# Patient Record
Sex: Male | Born: 1980 | Race: Black or African American | Hispanic: No | Marital: Married | Smoking: Current some day smoker
Health system: Southern US, Community
[De-identification: ages and names within clinical notes are randomized; demographics above are authoritative.]

## PROBLEM LIST (undated history)

## (undated) ENCOUNTER — Emergency Department (HOSPITAL_COMMUNITY): Payer: Self-pay | Source: Home / Self Care

## (undated) DIAGNOSIS — I10 Essential (primary) hypertension: Secondary | ICD-10-CM

## (undated) HISTORY — PX: OTHER SURGICAL HISTORY: SHX169

---

## 2004-12-25 ENCOUNTER — Emergency Department (HOSPITAL_COMMUNITY): Admission: EM | Admit: 2004-12-25 | Discharge: 2004-12-25 | Payer: Self-pay | Admitting: Emergency Medicine

## 2005-08-13 ENCOUNTER — Emergency Department (HOSPITAL_COMMUNITY): Admission: EM | Admit: 2005-08-13 | Discharge: 2005-08-13 | Payer: Self-pay | Admitting: Emergency Medicine

## 2007-06-22 ENCOUNTER — Emergency Department (HOSPITAL_COMMUNITY): Admission: EM | Admit: 2007-06-22 | Discharge: 2007-06-22 | Payer: Self-pay | Admitting: Emergency Medicine

## 2007-09-10 ENCOUNTER — Emergency Department (HOSPITAL_COMMUNITY): Admission: EM | Admit: 2007-09-10 | Discharge: 2007-09-10 | Payer: Self-pay | Admitting: Emergency Medicine

## 2008-02-08 ENCOUNTER — Emergency Department (HOSPITAL_COMMUNITY): Admission: EM | Admit: 2008-02-08 | Discharge: 2008-02-08 | Payer: Self-pay | Admitting: Emergency Medicine

## 2009-04-05 ENCOUNTER — Ambulatory Visit (HOSPITAL_BASED_OUTPATIENT_CLINIC_OR_DEPARTMENT_OTHER): Admission: RE | Admit: 2009-04-05 | Discharge: 2009-04-05 | Payer: Self-pay | Admitting: Urology

## 2010-10-23 ENCOUNTER — Emergency Department (HOSPITAL_COMMUNITY): Payer: 59

## 2010-10-23 ENCOUNTER — Emergency Department (HOSPITAL_COMMUNITY)
Admission: EM | Admit: 2010-10-23 | Discharge: 2010-10-23 | Disposition: A | Discharge status: Home / Self Care | Payer: 59 | Attending: Emergency Medicine | Admitting: Emergency Medicine

## 2010-10-23 ENCOUNTER — Encounter: Payer: Self-pay | Admitting: *Deleted

## 2010-10-23 DIAGNOSIS — M549 Dorsalgia, unspecified: Secondary | ICD-10-CM

## 2010-10-23 DIAGNOSIS — W010XXA Fall on same level from slipping, tripping and stumbling without subsequent striking against object, initial encounter: Secondary | ICD-10-CM

## 2010-10-23 DIAGNOSIS — S2341XA Sprain of ribs, initial encounter: Secondary | ICD-10-CM | POA: Insufficient documentation

## 2010-10-23 DIAGNOSIS — M545 Low back pain, unspecified: Secondary | ICD-10-CM | POA: Insufficient documentation

## 2010-10-23 MED ORDER — CYCLOBENZAPRINE HCL 10 MG PO TABS: 10.0000 mg | ORAL_TABLET | Freq: Three times a day (TID) | ORAL | Status: AC | PRN

## 2010-10-23 MED ORDER — OXYCODONE-ACETAMINOPHEN 5-325 MG PO TABS: ORAL_TABLET | ORAL | Status: DC

## 2010-10-23 MED ORDER — DIAZEPAM 5 MG PO TABS
10.0000 mg | ORAL_TABLET | Freq: Once | ORAL | Status: AC
  Administered 2010-10-23: 10 mg via ORAL
  Filled 2010-10-23: qty 2

## 2010-10-23 MED ORDER — OXYCODONE-ACETAMINOPHEN 5-325 MG PO TABS
1.0000 | ORAL_TABLET | Freq: Once | ORAL | Status: AC
  Administered 2010-10-23: 1 via ORAL
  Filled 2010-10-23: qty 1
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
## 2010-10-23 NOTE — ED Notes (Signed)
EDPA at bedside to examine 

## 2010-10-23 NOTE — ED Notes (Signed)
States he fell this am injuring back , hurts to bend over

## 2010-10-23 NOTE — ED Notes (Signed)
Left in c/o spouse for transport home; in no distress

## 2010-10-30 NOTE — ED Provider Notes (Signed)
History     Chief Complaint  Patient presents with  . Fall    back pain   HPI  History reviewed. No pertinent past medical history.  History reviewed. No pertinent past surgical history.  History reviewed. No pertinent family history.  History  Substance Use Topics  . Smoking status: Not on file  . Smokeless tobacco: Not on file  . Alcohol Use: Yes      Review of Systems  Physical Exam  BP 116/70  Pulse 60  Temp(Src) 98.1 F (36.7 C) (Oral)  Resp 20  Ht 6\' 4"  (1.93 m)  Wt 270 lb (122.471 kg)  BMI 32.87 kg/m2  SpO2 100%  Physical Exam  ED Course  Procedures  MDM    I agree with the history, physical, assessment, and plan of care, with the following exceptions: None    Adeja Sarratt        Sunnie Nielsen, MD 10/30/10 [phone]

## 2011-07-26 ENCOUNTER — Emergency Department (HOSPITAL_COMMUNITY)
Admission: EM | Admit: 2011-07-26 | Discharge: 2011-07-26 | Disposition: A | Discharge status: Home / Self Care | Payer: Self-pay | Attending: Emergency Medicine | Admitting: Emergency Medicine

## 2011-07-26 ENCOUNTER — Encounter (HOSPITAL_COMMUNITY): Payer: Self-pay | Admitting: Emergency Medicine

## 2011-07-26 DIAGNOSIS — T6391XA Toxic effect of contact with unspecified venomous animal, accidental (unintentional), initial encounter: Secondary | ICD-10-CM | POA: Insufficient documentation

## 2011-07-26 DIAGNOSIS — M79642 Pain in left hand: Secondary | ICD-10-CM

## 2011-07-26 DIAGNOSIS — M7989 Other specified soft tissue disorders: Secondary | ICD-10-CM | POA: Insufficient documentation

## 2011-07-26 DIAGNOSIS — T63461A Toxic effect of venom of wasps, accidental (unintentional), initial encounter: Secondary | ICD-10-CM | POA: Insufficient documentation

## 2011-07-26 DIAGNOSIS — Y92009 Unspecified place in unspecified non-institutional (private) residence as the place of occurrence of the external cause: Secondary | ICD-10-CM | POA: Insufficient documentation

## 2011-07-26 DIAGNOSIS — M79609 Pain in unspecified limb: Secondary | ICD-10-CM | POA: Insufficient documentation

## 2011-07-26 MED ORDER — IBUPROFEN 800 MG PO TABS
ORAL_TABLET | ORAL | Status: AC
  Filled 2011-07-26: qty 1

## 2011-07-26 MED ORDER — IBUPROFEN 800 MG PO TABS
800.0000 mg | ORAL_TABLET | Freq: Once | ORAL | Status: AC
  Administered 2011-07-26: 800 mg via ORAL

## 2011-07-26 MED ORDER — DIPHENHYDRAMINE HCL 25 MG PO CAPS
50.0000 mg | ORAL_CAPSULE | Freq: Once | ORAL | Status: AC
  Administered 2011-07-26: 50 mg via ORAL
  Filled 2011-07-26: qty 2

## 2011-07-26 NOTE — ED Notes (Signed)
MD at bedside. 

## 2011-07-26 NOTE — ED Provider Notes (Signed)
History     CSN: [id number]  Arrival date & time 07/26/11  2023   First MD Initiated Contact with Patient 07/26/11 2107      Chief Complaint  Patient presents with  . Insect Bite    Location-L hand/radiation-none/quality-dull/duration-12 hours/timing-constant/severity-mild to moderate/No associated sxs/No prior treatment) Patient is a 31 y.o. male presenting with animal bite. The history is provided by the patient. No language interpreter was used.  Animal Bite  The incident occurred today. The incident occurred at home. He came to the ER via personal transport. There is an injury to the left hand (Wasp sting). The pain is moderate. It is unlikely that a foreign body is present. Pertinent negatives include no chest pain, no fussiness, no numbness, no visual disturbance, no abdominal pain, no bowel incontinence, no nausea, no vomiting, no bladder incontinence, no headaches, no hearing loss, no neck pain, no focal weakness, no decreased responsiveness, no light-headedness, no loss of consciousness, no seizures, no tingling, no weakness, no cough, no difficulty breathing and no memory loss. There have been no prior injuries to these areas. He has been behaving normally. There were no sick contacts. He has received no recent medical care.    History reviewed. No pertinent past medical history.  Past Surgical History  Procedure Date  . Tubes in ears     childhood    History reviewed. No pertinent family history.  History  Substance Use Topics  . Smoking status: Current Everyday Smoker  . Smokeless tobacco: Not on file  . Alcohol Use: Yes      Review of Systems  Constitutional: Negative for fever, chills and decreased responsiveness.  HENT: Negative for hearing loss, neck pain and neck stiffness.   Eyes: Negative for visual disturbance.  Respiratory: Negative for cough, chest tightness and shortness of breath.   Cardiovascular: Negative for chest pain, palpitations and leg  swelling.  Gastrointestinal: Negative for nausea, vomiting, abdominal pain, diarrhea, constipation, blood in stool, abdominal distention and bowel incontinence.  Genitourinary: Negative for bladder incontinence, dysuria, urgency, hematuria and difficulty urinating.  Musculoskeletal: Negative for back pain and gait problem.       L hand swelling after wasp sting.   Skin: Negative for rash.  Neurological: Negative for dizziness, tingling, tremors, focal weakness, seizures, loss of consciousness, syncope, facial asymmetry, speech difficulty, weakness, light-headedness, numbness and headaches.  Hematological: Negative for adenopathy. Does not bruise/bleed easily.  Psychiatric/Behavioral: Negative for memory loss and confusion.    Allergies  Review of patient's allergies indicates no known allergies.  Home Medications  No current outpatient prescriptions on file.  BP 122/76  Pulse 73  Temp(Src) 98.2 F (36.8 C) (Oral)  Resp 20  SpO2 96%  Physical Exam  Constitutional: He is oriented to person, place, and time. He appears well-developed and well-nourished. No distress.  HENT:  Head: Normocephalic.  Eyes: Conjunctivae are normal.  Neck: Normal range of motion. Neck supple.  Cardiovascular: Normal rate, regular rhythm, normal heart sounds and intact distal pulses.   No murmur heard. Pulmonary/Chest: Effort normal and breath sounds normal. No respiratory distress. He has no wheezes. He has no rales. He exhibits no tenderness.  Abdominal: Soft. Bowel sounds are normal. He exhibits no distension. There is no tenderness.  Musculoskeletal: Normal range of motion. He exhibits no edema and no tenderness.       Left wrist: Normal.       Left forearm: Normal.       Left hand: He exhibits tenderness and swelling.  He exhibits normal range of motion, no bony tenderness, normal capillary refill, no deformity and no laceration. normal sensation noted. Normal strength noted.        Hands: Neurological: He is alert and oriented to person, place, and time.  Skin: Skin is warm and dry. He is not diaphoretic.  Psychiatric: He has a normal mood and affect.    ED Course  Procedures (including critical care time)  Labs Reviewed - No data to display No results found.   No diagnosis found.    MDM  Pt is a well appearing 31yo AAM stung by a wasp on L hand 13 hours ago who presents with continued pain and swelling at site. Pt denies hx of airway swelling with past insect stings. No s/s of anaphylaxis. Pt has not tried any meds or tx at home for unknown reasons. Benadryl, motrin and ice given in ED. Tetanus UTD. D/C home with strict return precautions.         Consuello Masse, MD 07/27/11 [phone]

## 2011-07-26 NOTE — ED Notes (Signed)
Patient was stung by a bee at 0800 this morning.  Pt has had increase in swelling to his left hand.  Pt denies any SOB.

## 2011-07-26 NOTE — Discharge Instructions (Signed)
Bee, Wasp, or Hornet Sting Your caregiver has diagnosed you as having an insect sting. An insect sting appears as a red lump in the skin that sometimes has a tiny hole in the center, or it may have a stinger in the center of the wound. The most common stings are from wasps, hornets and bees. Individuals have different reactions to insect stings.  A normal reaction may cause pain, swelling, and redness around the sting site.   A localized allergic reaction may cause swelling and redness that extends beyond the sting site.   A large local reaction may continue to develop over the next 12 to 36 hours.   On occasion, the reactions can be severe (anaphylactic reaction). An anaphylactic reaction may cause wheezing; difficulty breathing; chest pain; fainting; raised, itchy, red patches on the skin; a sick feeling to your stomach (nausea); vomiting; cramping; or diarrhea. If you have had an anaphylactic reaction to an insect sting in the past, you are more likely to have one again.  HOME CARE INSTRUCTIONS   With bee stings, a small sac of poison is left in the wound. Brushing across this with something such as a credit card, or anything similar, will help remove this and decrease the amount of the reaction. This same procedure will not help a wasp sting as they do not leave behind a stinger and poison sac.   Apply a cold compress for 10 to 20 minutes every hour for 1 to 2 days, depending on severity, to reduce swelling and itching.   To lessen pain, a paste made of water and baking soda may be rubbed on the bite or sting and left on for 5 minutes.   To relieve itching and swelling, you may use take medication or apply medicated creams or lotions as directed.   Only take over-the-counter or prescription medicines for pain, discomfort, or fever as directed by your caregiver.   Wash the sting site daily with soap and water. Apply antibiotic ointment on the sting site as directed.   If you suffered a  severe reaction:   If you did not require hospitalization, an adult will need to stay with you for 24 hours in case the symptoms return.   You may need to wear a medical bracelet or necklace stating the allergy.   You and your family need to learn when and how to use an anaphylaxis kit or epinephrine injection.   If you have had a severe reaction before, always carry your anaphylaxis kit with you.  SEEK MEDICAL CARE IF:   None of the above helps within 2 to 3 days.   The area becomes red, warm, tender, and swollen beyond the area of the bite or sting.   You have an oral temperature above 102 F (38.9 C).  SEEK IMMEDIATE MEDICAL CARE IF:  You have symptoms of an allergic reaction which are:  Wheezing.   Difficulty breathing.   Chest pain.   Lightheadedness or fainting.   Itchy, raised, red patches on the skin.   Nausea, vomiting, cramping or diarrhea.  ANY OF THESE SYMPTOMS MAY REPRESENT A SERIOUS PROBLEM THAT IS AN EMERGENCY. Do not wait to see if the symptoms will go away. Get medical help right away. Call your local emergency services (911 in [place] DO NOT drive yourself to the hospital. MAKE SURE YOU:   Understand these instructions.   Will watch your condition.   Will get help right away if you are not doing well or  get worse.  Document Released: 04/02/2005 Document Revised: 03/22/2011 Document Reviewed: 09/17/2009 [facility] Patient Information 2012 [place], [place].Allergic Reaction, Mild to Moderate Allergies may happen from anything your body is sensitive to. This may be food, medications, pollens, chemicals, and nearly anything around you in everyday life that produces allergens. An allergen is anything that causes an allergy producing substance. Allergens cause your body to release allergic antibodies. Through a chain of events, they cause a release of histamine into the blood stream. Histamines are meant to protect you, but they also cause your discomfort. This is why  antihistamines are often used for allergies. Heredity is often a factor in causing allergic reactions. This means you may have some of the same allergies as your parents. Allergies happen in all age groups. You may have some idea of what caused your reaction. There are many allergens around [place]. It may be difficult to know what caused your reaction. If this is a first time event, it may never happen again. Allergies cannot be cured but can be controlled with medications. SYMPTOMS  You may get some or all of the following problems from allergies.  Swelling and itching in and around the mouth.   Tearing, itchy eyes.   Nasal congestion and runny nose.   Sneezing and coughing.   An itchy red rash or hives.   Vomiting or diarrhea.   Difficulty breathing.  Seasonal allergies occur in all age groups. They are seasonal because they usually occur during the same season every year. They may be a reaction to molds, grass pollens, or tree pollens. Other causes of allergies are house dust mite allergens, pet dander and mold spores. These are just a common few of the thousands of allergens around [place]. All of the symptoms listed above happen when you come in contact with pollens and other allergens. Seasonal allergies are usually not life threatening. They are generally more of a nuisance that can often be handled using medications. Hay fever is a combination of all or some of the above listed allergy problems. It may often be treated with simple over-the-counter medications such as diphenhydramine. Take medication as directed. Check with your caregiver or package insert for child dosages. TREATMENT AND HOME CARE INSTRUCTIONS If hives or rash are present:  Take medications as directed.   You may use an over-the-counter antihistamine (diphenhydramine) for hives and itching as needed. Do not drive or drink alcohol until medications used to treat the reaction have worn off. Antihistamines tend to make people  sleepy.   Apply cold cloths (compresses) to the skin or take baths in cool water. This will help itching. Avoid hot baths or showers. Heat will make a rash and itching worse.   If your allergies persist and become more severe, and over the counter medications are not effective, there are many new medications your caretaker can prescribe. Immunotherapy or desensitizing injections can be used if all else fails. Follow up with your caregiver if problems continue.  SEEK MEDICAL CARE IF:   Your allergies are becoming progressively more troublesome.   You suspect a food allergy. Symptoms generally happen within 30 minutes of eating a food.   Your symptoms have not gone away within 2 days or are getting worse.   You develop new symptoms.   You want to retest yourself or your child with a food or drink you think causes an allergic reaction. Never test yourself or your child of a suspected allergy without being under the watchful eye of your  caregivers. A second exposure to an allergen may be life-threatening.  SEEK IMMEDIATE MEDICAL CARE IF:  You develop difficulty breathing or wheezing, or have a tight feeling in your chest or throat.   You develop a swollen mouth, hives, swelling, or itching all over your body.  A severe reaction with any of the above problems should be considered life-threatening. If you suddenly develop difficulty breathing call for local emergency medical help. THIS IS AN EMERGENCY. MAKE SURE YOU:   Understand these instructions.   Will watch your condition.   Will get help right away if you are not doing well or get worse.  Document Released: 01/28/2007 Document Revised: 03/22/2011 Document Reviewed: 01/28/2007 [employer] Patient Information 2012 [employer]  TAKE OVER THE COUNTER MOTRIN AND BENADRYL AS INSTRUCTED ON BOTTLE FOR PAIN AND SWELLING. PLACE ICE TO LEFT HAND AND ELEVATE UNTIL SYMPTOMS IMPROVING.

## 2011-07-27 NOTE — ED Provider Notes (Signed)
I have personally seen and examined the patient.  I have discussed the plan of care with the resident.  I have reviewed the documentation on PMH/FH/Soc. History.  I have reviewed the documentation of the resident and agree.  Pt well appearing no signs of angioedema Safe for d/c  Joya Gaskins, MD 07/27/11 1551

## 2013-04-15 ENCOUNTER — Ambulatory Visit (HOSPITAL_COMMUNITY): Admission: RE | Admit: 2013-04-15 | Payer: No Typology Code available for payment source | Source: Ambulatory Visit

## 2013-04-15 ENCOUNTER — Encounter (HOSPITAL_COMMUNITY): Payer: Self-pay | Admitting: Emergency Medicine

## 2013-04-15 ENCOUNTER — Emergency Department (HOSPITAL_COMMUNITY): Payer: Self-pay

## 2013-04-15 ENCOUNTER — Emergency Department (HOSPITAL_COMMUNITY)
Admission: EM | Admit: 2013-04-15 | Discharge: 2013-04-15 | Discharge status: Against Medical Advice | Payer: Self-pay | Attending: Emergency Medicine | Admitting: Emergency Medicine

## 2013-04-15 DIAGNOSIS — M549 Dorsalgia, unspecified: Secondary | ICD-10-CM

## 2013-04-15 DIAGNOSIS — Y92410 Unspecified street and highway as the place of occurrence of the external cause: Secondary | ICD-10-CM | POA: Insufficient documentation

## 2013-04-15 DIAGNOSIS — F17200 Nicotine dependence, unspecified, uncomplicated: Secondary | ICD-10-CM | POA: Insufficient documentation

## 2013-04-15 DIAGNOSIS — Y9389 Activity, other specified: Secondary | ICD-10-CM | POA: Insufficient documentation

## 2013-04-15 DIAGNOSIS — M542 Cervicalgia: Secondary | ICD-10-CM

## 2013-04-15 DIAGNOSIS — S0993XA Unspecified injury of face, initial encounter: Secondary | ICD-10-CM | POA: Insufficient documentation

## 2013-04-15 DIAGNOSIS — IMO0002 Reserved for concepts with insufficient information to code with codable children: Secondary | ICD-10-CM | POA: Insufficient documentation

## 2013-04-15 NOTE — ED Provider Notes (Signed)
Medical screening examination/treatment/procedure(s) were performed by non-physician practitioner and as supervising physician I was immediately available for consultation/collaboration.  EKG Interpretation   None         Junius Argyle, MD 04/15/13 [phone]
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
## 2013-04-15 NOTE — ED Notes (Signed)
Mother of patient and patient states that they can no longer wait for treatment. Informed patient of risks of leaving and that he would have to leave AMA. States that he understood and informed Trixie Dredge [facility] of patients requests.
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
## 2013-05-27 ENCOUNTER — Emergency Department (HOSPITAL_COMMUNITY)
Admission: EM | Admit: 2013-05-27 | Discharge: 2013-05-27 | Disposition: A | Discharge status: Home / Self Care | Payer: Self-pay | Attending: Emergency Medicine | Admitting: Emergency Medicine

## 2013-05-27 ENCOUNTER — Encounter (HOSPITAL_COMMUNITY): Payer: Self-pay | Admitting: Emergency Medicine

## 2013-05-27 DIAGNOSIS — Z79899 Other long term (current) drug therapy: Secondary | ICD-10-CM | POA: Insufficient documentation

## 2013-05-27 DIAGNOSIS — R21 Rash and other nonspecific skin eruption: Secondary | ICD-10-CM | POA: Insufficient documentation

## 2013-05-27 DIAGNOSIS — K0889 Other specified disorders of teeth and supporting structures: Secondary | ICD-10-CM

## 2013-05-27 DIAGNOSIS — K089 Disorder of teeth and supporting structures, unspecified: Secondary | ICD-10-CM | POA: Insufficient documentation

## 2013-05-27 DIAGNOSIS — M542 Cervicalgia: Secondary | ICD-10-CM | POA: Insufficient documentation

## 2013-05-27 DIAGNOSIS — F17200 Nicotine dependence, unspecified, uncomplicated: Secondary | ICD-10-CM | POA: Insufficient documentation

## 2013-05-27 MED ORDER — SULFAMETHOXAZOLE-TRIMETHOPRIM 800-160 MG PO TABS: 1.0000 | ORAL_TABLET | Freq: Two times a day (BID) | ORAL | Status: DC

## 2013-05-27 MED ORDER — CIPROFLOXACIN HCL 250 MG PO TABS
500.0000 mg | ORAL_TABLET | Freq: Once | ORAL | Status: AC
  Administered 2013-05-27: 500 mg via ORAL
  Filled 2013-05-27: qty 2

## 2013-05-27 MED ORDER — CIPROFLOXACIN HCL 500 MG PO TABS: 500.0000 mg | ORAL_TABLET | Freq: Two times a day (BID) | ORAL | Status: DC

## 2013-05-27 MED ORDER — DICLOFENAC SODIUM 75 MG PO TBEC: 75.0000 mg | DELAYED_RELEASE_TABLET | Freq: Two times a day (BID) | ORAL | Status: DC

## 2013-05-27 MED ORDER — PREDNISONE 50 MG PO TABS
60.0000 mg | ORAL_TABLET | Freq: Once | ORAL | Status: AC
  Administered 2013-05-27: 60 mg via ORAL
  Filled 2013-05-27 (×2): qty 1

## 2013-05-27 MED ORDER — SULFAMETHOXAZOLE-TMP DS 800-160 MG PO TABS
1.0000 | ORAL_TABLET | Freq: Once | ORAL | Status: AC
  Administered 2013-05-27: 1 via ORAL
  Filled 2013-05-27: qty 1

## 2013-05-27 MED ORDER — KETOROLAC TROMETHAMINE 10 MG PO TABS
10.0000 mg | ORAL_TABLET | Freq: Once | ORAL | Status: AC
  Administered 2013-05-27: 10 mg via ORAL
  Filled 2013-05-27: qty 1

## 2013-05-27 NOTE — ED Notes (Signed)
Pt c/o redness and itching to bilateral arms and neck.

## 2013-05-27 NOTE — Discharge Instructions (Signed)
Please use Cipro and Septra for the oral infection, as well as the possible infection going on in the right forearm. Please use diclofenac for inflammation and swelling. Please see a dentist as sone as possible. Please apply warm compresses to your arm daily. Please see your primary physician, or return to the emergency department if the red raised area of the arm is not improving.

## 2013-05-27 NOTE — ED Provider Notes (Signed)
Medical screening examination/treatment/procedure(s) were performed by non-physician practitioner and as supervising physician I was immediately available for consultation/collaboration.  Raheen Capili L [place] Gamero, MD 05/27/13 1513 

## 2013-05-27 NOTE — ED Provider Notes (Signed)
CSN: [id number]     Arrival date & time 05/27/13  1100 History   First MD Initiated Contact with Patient 05/27/13 1125     Chief Complaint  Patient presents with  . Rash     (Consider location/radiation/quality/duration/timing/severity/associated sxs/prior Treatment) HPI Comments: Patient presents to the emergency department with a complaint of rash to the forearm. The patient states that he was in his usual state of health until this morning when he noticed some itching around the area of his mid forearm. He then noted redness and swelling of the area. The itching and discomfort seemed to get progressively worse. Patient states he has also noticed some pain of the right side of his jaw and under his chin. And felt that he also had some swelling on the outside of his left neck. He has not had any injury. No scratch, or no broken skin areas of any of the sites. It is of note that he had an appointment with the dentists on yesterday, he was unable to make it because of a flat tire.  The history is provided by the patient.    History reviewed. No pertinent past medical history. History reviewed. No pertinent past surgical history. No family history on file. History  Substance Use Topics  . Smoking status: Current Some Day Smoker    Types: Cigarettes  . Smokeless tobacco: Never Used  . Alcohol Use: Yes    Review of Systems  Constitutional: Negative for activity change.       All ROS Neg except as noted in HPI  HENT: Positive for dental problem. Negative for trouble swallowing.   Eyes: Negative for photophobia and discharge.  Respiratory: Negative for cough, shortness of breath, wheezing and stridor.   Cardiovascular: Negative for chest pain and palpitations.  Gastrointestinal: Negative for abdominal pain and blood in stool.  Genitourinary: Negative for dysuria, frequency and hematuria.  Musculoskeletal: Positive for neck pain. Negative for arthralgias and back pain.  Skin: Positive  for rash.  Neurological: Negative for dizziness, seizures and speech difficulty.  Psychiatric/Behavioral: Negative for hallucinations and confusion.      Allergies  Review of patient's allergies indicates no known allergies.  Home Medications   Current Outpatient Rx  Name  Route  Sig  Dispense  Refill  . phentermine 37.5 MG capsule   Oral   Take 37.5 mg by mouth daily.          BP 137/71  Pulse 58  Temp(Src) 98.1 F (36.7 C)  Resp 18  Ht 6\' 3"  (1.905 m)  Wt 275 lb (124.739 kg)  BMI 34.37 kg/m2  SpO2 97% Physical Exam  Nursing note and vitals reviewed. Constitutional: He is oriented to person, place, and time. He appears well-developed and well-nourished.  Non-toxic appearance.  HENT:  Head: Normocephalic.  Right Ear: Tympanic membrane and external ear normal.  Left Ear: Tympanic membrane and external ear normal.  The right lower second molar shows a cavity at the base. The third molar is attempting to Molly Maduro that seems to already have some decay as well. There is some swelling of the gum, but no visible abscess in this area. There is no swelling under the tongue. The sublingual area is soft to palpation. The airway is patent. The speech is clear and understandable.  Eyes: EOM and lids are normal. Pupils are equal, round, and reactive to light.  Neck: Normal range of motion. Neck supple. Carotid bruit is not present.  Cardiovascular: Normal rate, regular rhythm, normal heart  sounds, intact distal pulses and normal pulses.   Pulmonary/Chest: Breath sounds normal. No respiratory distress.  Abdominal: Soft. Bowel sounds are normal. There is no tenderness. There is no guarding.  Musculoskeletal: Normal range of motion.  Lymphadenopathy:       Head (right side): No submandibular adenopathy present.       Head (left side): No submandibular adenopathy present.    He has no cervical adenopathy.  Neurological: He is alert and oriented to person, place, and time. He has normal  strength. No cranial nerve deficit or sensory deficit.  Skin: Skin is warm and dry.  There is a red raised warm area of the mid forearm at the palmar surface. There is no red streaking appreciated. There's no evidence of hives. There no other lesions or abnormalities noted of the skin.  Psychiatric: He has a normal mood and affect. His speech is normal.    ED Course  Procedures (including critical care time) Labs Review Labs Reviewed - No data to display Imaging Review No results found.  EKG Interpretation   None       MDM   Final diagnoses:  None    **I have reviewed nursing notes, vital signs, and all appropriate lab and imaging results for this patient.*  Patient developed redness and itching of the right forearm accompanied by soreness of the left neck, and also complains of a toothache of the right lower jaw area.  vital signs are well within normal limits. The pulse oximetry is 97% on room air, within normal limits by my interpretation.  The plan at this time is for the patient to be treated with Cipro and Septra to cover for possible infection as well as the infected tooth. Patient will also be placed on diclofenac for inflammation of the site of the forearm. The patient is given careful structures to return if any changes, red streaking, high fevers, changes in breathing, or difficulty with swallowing.  Kathie DikeHobson M Danelle Curiale, PA-C 05/27/13 1219

## 2013-05-28 ENCOUNTER — Encounter (HOSPITAL_COMMUNITY): Payer: Self-pay | Admitting: Emergency Medicine

## 2014-05-11 ENCOUNTER — Emergency Department: Payer: Self-pay | Admitting: Internal Medicine

## 2015-08-30 ENCOUNTER — Encounter: Payer: Self-pay | Admitting: Emergency Medicine

## 2015-08-30 ENCOUNTER — Emergency Department
Admission: EM | Admit: 2015-08-30 | Discharge: 2015-08-30 | Disposition: A | Discharge status: Home / Self Care | Payer: Self-pay | Attending: Emergency Medicine | Admitting: Emergency Medicine

## 2015-08-30 ENCOUNTER — Emergency Department: Payer: Self-pay

## 2015-08-30 DIAGNOSIS — G44019 Episodic cluster headache, not intractable: Secondary | ICD-10-CM | POA: Insufficient documentation

## 2015-08-30 DIAGNOSIS — F17210 Nicotine dependence, cigarettes, uncomplicated: Secondary | ICD-10-CM | POA: Insufficient documentation

## 2015-08-30 DIAGNOSIS — Z792 Long term (current) use of antibiotics: Secondary | ICD-10-CM | POA: Insufficient documentation

## 2015-08-30 DIAGNOSIS — Z79899 Other long term (current) drug therapy: Secondary | ICD-10-CM | POA: Insufficient documentation

## 2015-08-30 MED ORDER — SUMATRIPTAN SUCCINATE 6 MG/0.5ML ~~LOC~~ SOLN
SUBCUTANEOUS | Status: AC
  Administered 2015-08-30: 6 mg via SUBCUTANEOUS
  Filled 2015-08-30: qty 0.5

## 2015-08-30 MED ORDER — SUMATRIPTAN SUCCINATE 50 MG PO TABS: 50.0000 mg | ORAL_TABLET | Freq: Once | ORAL | Status: DC | PRN

## 2015-08-30 MED ORDER — SUMATRIPTAN SUCCINATE 6 MG/0.5ML ~~LOC~~ SOLN
6.0000 mg | SUBCUTANEOUS | Status: AC
  Administered 2015-08-30: 6 mg via SUBCUTANEOUS

## 2015-08-30 NOTE — ED Notes (Signed)
Patient ambulatory to triage with steady gait, without difficulty or distress noted; pt reports right sided HA this evening unrelieved by goody powder; denies accomp symptoms; denies hx of same

## 2015-08-30 NOTE — ED Provider Notes (Signed)
[facility] Emergency Department Provider Note  ____________________________________________  Time seen: Approximately 9:22 PM  I have reviewed the triage vital signs and the nursing notes.   HISTORY  Chief Complaint Headache    HPI Todd Contreras is a 35 y.o. male this past medical history includes occasional tobacco smoker, obesity, and obstructive sleep apnea diagnosed by a sleep study but who does not use a CPAP.  He presents with acute onset of intermittent sharp and stabbing severe pain to the right side of his face and head.  He reports that it is right behind his eye and that light makes it a little bit worse.  He also notes that the right side of his nose has been running as well and that both his face and the anterior part of the right side of his head hurts.  It will last 15 or more minutes and then will get better but then it recurs again.  He has had similar episodes in the past but the onset about 2 hours ago today has been the most severe.  Nothing in particular makes it better nor worse.  He is concerned because his father reportedly died of a ruptured brain aneurysm about 11 years ago when he was complaining of a headache at the time as well but refused to go to the emergency department.    He denies nausea, vomiting, chest pain, shortness of breath, fever/chills, neck pain, neck stiffness.   History reviewed. No pertinent past medical history.  There are no active problems to display for this patient.   Past Surgical History  Procedure Laterality Date  . Tubes in ears      childhood    Current Outpatient Rx  Name  Route  Sig  Dispense  Refill  . ciprofloxacin (CIPRO) 500 MG tablet   Oral   Take 1 tablet (500 mg total) by mouth 2 (two) times daily.   14 tablet   0   . diclofenac (VOLTAREN) 75 MG EC tablet   Oral   Take 1 tablet (75 mg total) by mouth 2 (two) times daily.   14 tablet   0   . phentermine 37.5 MG  capsule   Oral   Take 37.5 mg by mouth daily.         Marland Kitchen. sulfamethoxazole-trimethoprim (SEPTRA DS) 800-160 MG per tablet   Oral   Take 1 tablet by mouth 2 (two) times daily.   14 tablet   0   . SUMAtriptan (IMITREX) 50 MG tablet   Oral   Take 1 tablet (50 mg total) by mouth once as needed for headache. May repeat in 2 hours if headache persists or recurs.   10 tablet   0     Allergies Review of patient's allergies indicates no known allergies.  No family history on file.  Social History Social History  Substance Use Topics  . Smoking status: Current Some Day Smoker    Types: Cigarettes  . Smokeless tobacco: Never Used  . Alcohol Use: Yes    Review of Systems Constitutional: No fever/chills Eyes: No visual changes. Pain behind his right eye and the right side of his face and head ENT: No sore throat. Clear rhinorrhea from his right nostril while he is having the pain Cardiovascular: Denies chest pain. Respiratory: Denies shortness of breath. Gastrointestinal: No abdominal pain.  No nausea, no vomiting.  No diarrhea.  No constipation. Genitourinary: Negative for dysuria. Musculoskeletal: Negative for back pain. Skin:  Negative for rash. Neurological: Intermittent/episodic severe pain in the right side of his face and head behind his eyes and including running from his right nostril  10-point ROS otherwise negative.  ____________________________________________   PHYSICAL EXAM:  VITAL SIGNS: ED Triage Vitals  Enc Vitals Group     BP 08/30/15 2025 138/81 mmHg     Pulse Rate 08/30/15 2025 71     Resp 08/30/15 2025 20     Temp 08/30/15 2025 98 F (36.7 C)     Temp src --      SpO2 08/30/15 2025 95 %     Weight 08/30/15 2025 320 lb (145.151 kg)     Height 08/30/15 2025  (1.905 m)     Head Cir --      Peak Flow --      Pain Score 08/30/15 2024 9     Pain Loc --      Pain Edu? --      Excl. in GC? --     Constitutional: Alert and oriented. Well  appearing and in no acute distress. Eyes: Conjunctivae are normal. PERRL. EOMI. No nystagmus. Head: Atraumatic.  Tenderness to palpation of the right side of his face and around the right temple. Nose: No congestion/rhinnorhea. Mouth/Throat: Mucous membranes are moist.  Oropharynx non-erythematous. Neck: No stridor.  No meningeal signs.   Cardiovascular: Normal rate, regular rhythm. Good peripheral circulation. Grossly normal heart sounds.   Respiratory: Normal respiratory effort.  No retractions. Lungs CTAB. Gastrointestinal: Soft and nontender. No distention.  Musculoskeletal: No lower extremity tenderness nor edema. No gross deformities of extremities. Neurologic:  Normal speech and language. No gross focal neurologic deficits are appreciated.  Skin:  Skin is warm, dry and intact. No rash noted. Psychiatric: Mood and affect are normal. Speech and behavior are normal.  ____________________________________________   LABS (all labs ordered are listed, but only abnormal results are displayed)  Labs Reviewed - No data to display ____________________________________________  EKG  None ____________________________________________  RADIOLOGY   Ct Head Wo Contrast  08/30/2015  CLINICAL DATA:  Right-sided headache for 2 hours, acute onset. Family history of intracranial aneurysm. EXAM: CT HEAD WITHOUT CONTRAST TECHNIQUE: Contiguous axial images were obtained from the base of the skull through the vertex without intravenous contrast. COMPARISON:  None. FINDINGS: Ventricles and sulci appear symmetrical. No ventricular dilatation. No mass effect or midline shift. No abnormal extra-axial fluid collections. Gray-white matter junctions are distinct. Basal cisterns are not effaced. No evidence of acute intracranial hemorrhage. No depressed skull fractures. Opacification of the right maxillary antrum with mucosal thickening throughout the ethmoid air cells, left maxillary antrum, and sphenoid  sinuses. Mastoid air cells are not opacified. IMPRESSION: No acute intracranial abnormalities. Presumed inflammatory changes in the paranasal sinuses. Electronically Signed   By: Burman Nieves M.D.   On: 08/30/2015 23:12    ____________________________________________   PROCEDURES  Procedure(s) performed: None  Critical Care performed: No ____________________________________________   INITIAL IMPRESSION / ASSESSMENT AND PLAN / ED COURSE  Pertinent labs & imaging results that were available during my care of the patient were reviewed by me and considered in my medical decision making (see chart for details).  The patient's history is consistent with a cluster headache.  I put him on a nonrebreather at 15 L and then going to check on him and 15 minutes.  I anticipate that he will feel much better, but if not, I will also try sumatriptan.  We discussed imaging but at this time  I would like to treat him empirically and he understands and agrees.  ----------------------------------------- 11:11 PM on 08/30/2015 -----------------------------------------  The patient felt considerably better after 15 minutes on the nonrebreather but the pain was not completely gone.  He has wife continued to be somewhat concerned about his headache and the history with his father.  After again discussing the cost/benefit ratio, they prefer to go ahead and get a CT scan of his head.  I ordered that and am awaiting the results although to my eye looks normal.  I also gave him a dose of sumatriptan subcutaneous to see if that would help and he thinks that it then again further help.  He has only a very minor residual amount of pain whereas before it was severe.  He has got a follow-up as an outpatient and I gave my usual and customary return precautions.  I was going to prescribe sumatriptan subcutaneous as an outpatient but I looked up the cost and it cost more than $1200.  Instead as an alternative that may be  helpful, I prescribed sumatriptan 50 mg by mouth with a coupon from [email or web address].  Although this is more of a migraine treatment, if that provides some relief it may be helpful to him.  I explained all this to him as well. ____________________________________________  FINAL CLINICAL IMPRESSION(S) / ED DIAGNOSES  Final diagnoses:  Episodic cluster headache, not intractable     MEDICATIONS GIVEN DURING THIS VISIT:  Medications  SUMAtriptan (IMITREX) injection 6 mg (6 mg Subcutaneous Given 08/30/15 2202)     NEW OUTPATIENT MEDICATIONS STARTED DURING THIS VISIT:  New Prescriptions   SUMATRIPTAN (IMITREX) 50 MG TABLET    Take 1 tablet (50 mg total) by mouth once as needed for headache. May repeat in 2 hours if headache persists or recurs.      Note:  This document was prepared using Dragon voice recognition software and may include unintentional dictation errors.   Loleta Rose, MD 08/30/15 [phone]

## 2015-08-30 NOTE — Discharge Instructions (Signed)
As we discussed, your symptoms are consistent with a cluster headache.  We believe that the prescribed medication may help your symptoms if you take it as soon as you feel symptoms starting, but more likely you will benefit from treating your obstructive sleep apnea and following up with a primary care doctor or neurologist.  Please read through the included information.  Return to the emergency department if you develop new or worsening symptoms that concern you.   Cluster Headache Cluster headaches are recognized by their pattern of deep, intense head pain. They normally occur on one side of your head, but they may "switch sides" in subsequent episodes. Typically, cluster headaches:   Are severe in nature.   Occur repeatedly over weeks to months and are followed by periods of no headaches.   Can last from 15 minutes to 3 hours.   Occur at the same time each day, often at night.   Occur several times a day. CAUSES The exact cause of cluster headaches is not known. Alcohol use may be associated with cluster headaches. SIGNS AND SYMPTOMS   Severe pain that begins in or around your eye or temple.   One-sided head pain.   Feeling sick to your stomach (nauseous).   Sensitivity to light.   Runny nose.   Eye redness, tearing, and nasal stuffiness on the side of your head where you are experiencing pain.   Sweaty, pale skin of the face.   Droopy or swollen eyelid.   Restlessness. DIAGNOSIS  Cluster headaches are diagnosed based on symptoms and a physical exam. Your health care provider may order a CT scan or an MRI of your head or lab tests to see if your headaches are caused by other medical conditions.  TREATMENT   Medicines for pain relief and to prevent recurrent attacks. Some people may need a combination of medicines.  Oxygen for pain relief.   Biofeedback programs to help reduce headache pain.  It may be helpful to keep a headache diary. This may help you  find a trend for what is triggering your headaches. Your health care provider can develop a treatment plan.  HOME CARE INSTRUCTIONS  During cluster periods:   Follow a regular sleep schedule. Do not vary the amount and time that you sleep from day to day. It is important to stay on the same schedule during a cluster period to help prevent headaches.   Avoid alcohol.   Stop smoking if you smoke.  SEEK MEDICAL CARE IF:  You have any changes from your previous cluster headaches either in intensity or frequency.   You are not getting relief from medicines you are taking.  SEEK IMMEDIATE MEDICAL CARE IF:   You faint.   You have weakness or numbness, especially on one side of your body or face.   You have double vision.   You have nausea or vomiting that is not relieved within several hours.   You cannot keep your balance or have difficulty talking or walking.   You have neck pain or stiffness.   You have a fever. MAKE SURE YOU:  Understand these instructions.   Will watch your condition.   Will get help right away if you are not doing well or get worse.   This information is not intended to replace advice given to you by your health care provider. Make sure you discuss any questions you have with your health care provider.   Document Released: 04/02/2005 Document Revised: 01/21/2013 Document Reviewed: 10/23/2012  Elsevier Interactive Patient Education ©2016 [employer] ° °

## 2015-08-30 NOTE — ED Notes (Signed)
Pt reports a headache to the right side, anterior behind eye x 2hours. Pt reports his father died of aneurism with similar symptoms.

## 2017-05-21 ENCOUNTER — Encounter: Payer: Self-pay | Admitting: Emergency Medicine

## 2017-05-21 ENCOUNTER — Emergency Department: Payer: No Typology Code available for payment source

## 2017-05-21 ENCOUNTER — Emergency Department
Admission: EM | Admit: 2017-05-21 | Discharge: 2017-05-21 | Disposition: A | Discharge status: Home / Self Care | Payer: No Typology Code available for payment source | Attending: Student in an Organized Health Care Education/Training Program | Admitting: Student in an Organized Health Care Education/Training Program

## 2017-05-21 ENCOUNTER — Other Ambulatory Visit: Payer: Self-pay

## 2017-05-21 DIAGNOSIS — R69 Illness, unspecified: Secondary | ICD-10-CM

## 2017-05-21 DIAGNOSIS — Z79899 Other long term (current) drug therapy: Secondary | ICD-10-CM | POA: Diagnosis not present

## 2017-05-21 DIAGNOSIS — F17210 Nicotine dependence, cigarettes, uncomplicated: Secondary | ICD-10-CM | POA: Diagnosis not present

## 2017-05-21 DIAGNOSIS — R0602 Shortness of breath: Secondary | ICD-10-CM | POA: Insufficient documentation

## 2017-05-21 DIAGNOSIS — R0789 Other chest pain: Secondary | ICD-10-CM | POA: Diagnosis present

## 2017-05-21 DIAGNOSIS — J40 Bronchitis, not specified as acute or chronic: Secondary | ICD-10-CM | POA: Diagnosis not present

## 2017-05-21 DIAGNOSIS — J111 Influenza due to unidentified influenza virus with other respiratory manifestations: Secondary | ICD-10-CM | POA: Insufficient documentation

## 2017-05-21 LAB — BASIC METABOLIC PANEL
ANION GAP: 10 (ref 5–15)
BUN: 8 mg/dL (ref 6–20)
CHLORIDE: 102 mmol/L (ref 101–111)
CO2: 22 mmol/L (ref 22–32)
Calcium: 8.8 mg/dL — ABNORMAL LOW (ref 8.9–10.3)
Creatinine, Ser: 1.35 mg/dL — ABNORMAL HIGH (ref 0.61–1.24)
GFR calc non Af Amer: >60 mL/min (ref >60)
Glucose, Bld: 105 mg/dL — ABNORMAL HIGH (ref 65–99)
POTASSIUM: 3.7 mmol/L (ref 3.5–5.1)
Sodium: 134 mmol/L — ABNORMAL LOW (ref 135–145)

## 2017-05-21 LAB — TROPONIN I: Troponin I: <0.03 ng/mL (ref <0.03)

## 2017-05-21 LAB — CBC
HEMATOCRIT: 43.5 % (ref 40.0–52.0)
Hemoglobin: 14.8 g/dL (ref 13.0–18.0)
MCH: 29.8 pg (ref 26.0–34.0)
MCHC: 33.9 g/dL (ref 32.0–36.0)
MCV: 88 fL (ref 80.0–100.0)
PLATELETS: 208 10*3/uL (ref 150–440)
RBC: 4.94 MIL/uL (ref 4.40–5.90)
RDW: 13.9 % (ref 11.5–14.5)
WBC: 6.2 10*3/uL (ref 3.8–10.6)

## 2017-05-21 LAB — URINALYSIS, COMPLETE (UACMP) WITH MICROSCOPIC
BACTERIA UA: NONE SEEN
BILIRUBIN URINE: NEGATIVE
GLUCOSE, UA: NEGATIVE mg/dL
Hgb urine dipstick: NEGATIVE
KETONES UR: NEGATIVE mg/dL
Leukocytes, UA: NEGATIVE
NITRITE: NEGATIVE
PH: 5 (ref 5.0–8.0)
Protein, ur: NEGATIVE mg/dL
RBC / HPF: NONE SEEN RBC/hpf (ref 0–5)
Specific Gravity, Urine: 1.014 (ref 1.005–1.030)
Squamous Epithelial / LPF: NONE SEEN

## 2017-05-21 MED ORDER — KETOROLAC TROMETHAMINE 30 MG/ML IJ SOLN
15.0000 mg | Freq: Once | INTRAMUSCULAR | Status: AC
  Administered 2017-05-21: 15 mg via INTRAVENOUS
  Filled 2017-05-21: qty 1

## 2017-05-21 MED ORDER — ALBUTEROL SULFATE HFA 108 (90 BASE) MCG/ACT IN AERS: 2.0000 | INHALATION_SPRAY | Freq: Four times a day (QID) | RESPIRATORY_TRACT | 2 refills | Status: DC | PRN

## 2017-05-21 MED ORDER — IPRATROPIUM-ALBUTEROL 0.5-2.5 (3) MG/3ML IN SOLN
3.0000 mL | Freq: Once | RESPIRATORY_TRACT | Status: AC
  Administered 2017-05-21: 3 mL via RESPIRATORY_TRACT
  Filled 2017-05-21: qty 3

## 2017-05-21 MED ORDER — PREDNISONE 20 MG PO TABS: 40.0000 mg | ORAL_TABLET | Freq: Every day | ORAL | 0 refills | Status: AC

## 2017-05-21 MED ORDER — DEXAMETHASONE 4 MG PO TABS
10.0000 mg | ORAL_TABLET | Freq: Once | ORAL | Status: AC
  Administered 2017-05-21: 10 mg via ORAL
  Filled 2017-05-21: qty 2.5

## 2017-05-21 MED ORDER — GUAIFENESIN-DM 100-10 MG/5ML PO SYRP: 5.0000 mL | ORAL_SOLUTION | ORAL | 0 refills | Status: DC | PRN

## 2017-05-21 NOTE — Discharge Instructions (Signed)
Be sure to drink plenty of fluids.  Return to the ER should he develop any worsening symptoms including worsening shortness of breath worsening chest pain or for any additional questions or concerns.

## 2017-05-21 NOTE — ED Notes (Signed)

## 2017-05-21 NOTE — ED Provider Notes (Signed)
[facility] Emergency Department Provider Note    None    (approximate)  I have reviewed the triage vital signs and the nursing notes.   HISTORY  Chief Complaint Chest Pain and Shortness of Breath    HPI Todd Contreras is a 37 y.o. male chief complaint of 2-3 days of muscle aches headache sore throat nonproductive cough and chills.  Patient states he does have some chest pain with cough.  Does have a history of bronchitis as well as smoking history.  No recent antibiotics.  No vomiting.  Denies any chest pain at rest.  No history of blood clots.  No history of cardiac issues.  Denies any history of diabetes or sickle cell disease.   History reviewed. No pertinent past medical history. No family history on file. Past Surgical History:  Procedure Laterality Date  . tubes in ears     childhood   There are no active problems to display for this patient.     Prior to Admission medications   Medication Sig Start Date End Date Taking? Authorizing Provider  ciprofloxacin (CIPRO) 500 MG tablet Take 1 tablet (500 mg total) by mouth 2 (two) times daily. 05/27/13   Ivery QualeBryant, Hobson, PA-C  diclofenac (VOLTAREN) 75 MG EC tablet Take 1 tablet (75 mg total) by mouth 2 (two) times daily. 05/27/13   Ivery QualeBryant, Hobson, PA-C  phentermine 37.5 MG capsule Take 37.5 mg by mouth daily.    [provider]  sulfamethoxazole-trimethoprim (SEPTRA DS) 800-160 MG per tablet Take 1 tablet by mouth 2 (two) times daily. 05/27/13   Ivery QualeBryant, Hobson, PA-C  SUMAtriptan (IMITREX) 50 MG tablet Take 1 tablet (50 mg total) by mouth once as needed for headache. May repeat in 2 hours if headache persists or recurs. 08/30/15 08/29/16  Loleta RoseForbach, Cory, MD    Allergies Patient has no known allergies.    Social History Social History   Tobacco Use  . Smoking status: Current Some Day Smoker    Types: Cigarettes  . Smokeless tobacco: Never Used  Substance Use Topics  . Alcohol  use: Yes  . Drug use: No    Review of Systems Patient denies headaches, rhinorrhea, blurry vision, numbness, shortness of breath, chest pain, edema, cough, abdominal pain, nausea, vomiting, diarrhea, dysuria, fevers, rashes or hallucinations unless otherwise stated above in HPI. ____________________________________________   PHYSICAL EXAM:  VITAL SIGNS: Vitals:   05/21/17 0919  BP: (!) 146/93  Pulse: 70  Resp: 20  Temp: 98.7 F (37.1 C)  SpO2: 95%    Constitutional: Alert and oriented. Well appearing and in no acute distress. Eyes: Conjunctivae are normal.  Head: Atraumatic. Nose: No congestion/rhinnorhea. Mouth/Throat: Mucous membranes are moist.  Oropharynx clear. Uvula midline Neck: No stridor. Hoarse voice.  No cervical adenopathy Painless ROM.  Cardiovascular: Normal rate, regular rhythm. Grossly normal heart sounds.  Good peripheral circulation. Respiratory: Normal respiratory effort.  No retractions. Lungs with coarse breathsounds throughout Gastrointestinal: Soft and nontender. No distention. No abdominal bruits. No CVA tenderness. Genitourinary:  Musculoskeletal: No lower extremity tenderness nor edema.  No joint effusions. Neurologic:  Normal speech and language. No gross focal neurologic deficits are appreciated. No facial droop Skin:  Skin is warm, dry and intact. No rash noted. Psychiatric: Mood and affect are normal. Speech and behavior are normal.  ____________________________________________   LABS (all labs ordered are listed, but only abnormal results are displayed)  Results for orders placed or performed during the hospital encounter of 05/21/17 (from  the past 24 hour(s))  Basic metabolic panel     Status: Abnormal   Collection Time: 05/21/17  9:17 AM  Result Value Ref Range   Sodium 134 (L) 135 - 145 mmol/L   Potassium 3.7 3.5 - 5.1 mmol/L   Chloride 102 101 - 111 mmol/L   CO2 22 22 - 32 mmol/L   Glucose, Bld 105 (H) 65 - 99 mg/dL   BUN 8 6 - 20  mg/dL   Creatinine, Ser 1.61 (H) 0.61 - 1.24 mg/dL   Calcium 8.8 (L) 8.9 - 10.3 mg/dL   GFR calc non Af Amer >60 >60 mL/min   GFR calc Af Amer >60 >60 mL/min   Anion gap 10 5 - 15  CBC     Status: None   Collection Time: 05/21/17  9:17 AM  Result Value Ref Range   WBC 6.2 3.8 - 10.6 K/uL   RBC 4.94 4.40 - 5.90 MIL/uL   Hemoglobin 14.8 13.0 - 18.0 g/dL   HCT 09.6 04.5 - 40.9 %   MCV 88.0 80.0 - 100.0 fL   MCH 29.8 26.0 - 34.0 pg   MCHC 33.9 32.0 - 36.0 g/dL   RDW 81.1 91.4 - 78.2 %   Platelets 208 150 - 440 K/uL  Troponin I     Status: None   Collection Time: 05/21/17  9:17 AM  Result Value Ref Range   Troponin I <0.03 <0.03 ng/mL  Urinalysis, Complete w Microscopic     Status: Abnormal   Collection Time: 05/21/17 10:07 AM  Result Value Ref Range   Color, Urine YELLOW (A) YELLOW   APPearance CLEAR (A) CLEAR   Specific Gravity, Urine 1.014 1.005 - 1.030   pH 5.0 5.0 - 8.0   Glucose, UA NEGATIVE NEGATIVE mg/dL   Hgb urine dipstick NEGATIVE NEGATIVE   Bilirubin Urine NEGATIVE NEGATIVE   Ketones, ur NEGATIVE NEGATIVE mg/dL   Protein, ur NEGATIVE NEGATIVE mg/dL   Nitrite NEGATIVE NEGATIVE   Leukocytes, UA NEGATIVE NEGATIVE   RBC / HPF NONE SEEN 0 - 5 RBC/hpf   WBC, UA 0-5 0 - 5 WBC/hpf   Bacteria, UA NONE SEEN NONE SEEN   Squamous Epithelial / LPF NONE SEEN NONE SEEN   Mucus PRESENT    ____________________________________________  EKG My review and personal interpretation at Time: 9:14   Indication: chest pain  Rate: 75  Rhythm: sinus Axis: normal Other: nonspecific st abnormality, no stemi, poor r wave progression ____________________________________________  RADIOLOGY  I personally reviewed all radiographic images ordered to evaluate for the above acute complaints and reviewed radiology reports and findings.  These findings were personally discussed with the patient.  Please see medical record for radiology  report.  ____________________________________________   PROCEDURES  Procedure(s) performed:  Procedures    Critical Care performed: no ____________________________________________   INITIAL IMPRESSION / ASSESSMENT AND PLAN / ED COURSE  Pertinent labs & imaging results that were available during my care of the patient were reviewed by me and considered in my medical decision making (see chart for details).  DDX: Asthma, copd, CHF, pna, ptx, malignancy, Pe, anemia   Todd Contreras is a 37 y.o. who presents to the ED with symptoms as described above.  Patient is well-appearing and in no acute distress.  His abdominal exam is soft and benign.  Lung sounds are consistent with bronchitis.  There is no evidence of CHF.  Not clinically consistent with ACS based on duration of symptoms negative troponin and nonischemic  EKG.  Not consistent with pericarditis.  Patient is low risk by Wells criteria and is PERC negative.  Given his myalgias and flulike symptoms this does seem most clinically consistent with flulike illness.  Symptoms have been going on for 2 days now and unlikely to derive any benefit from Tamiflu.  We will treat for bronchitis.  Patient tolerating oral hydration.  Patient stable and appropriate for outpatient follow-up.  Clinical Course as of May 22 1107  Tue May 21, 2017  1109 Anion gap: 10 [PR]    Clinical Course User Index [PR] Willy Eddy, MD     ____________________________________________   FINAL CLINICAL IMPRESSION(S) / ED DIAGNOSES  Final diagnoses:  Influenza-like illness  Bronchitis      NEW MEDICATIONS STARTED DURING THIS VISIT:  New Prescriptions   No medications on file     Note:  This document was prepared using Dragon voice recognition software and may include unintentional dictation errors.    Willy Eddy, MD 05/21/17 [phone]

## 2017-05-21 NOTE — ED Notes (Signed)
Sent from Dr office.

## 2017-05-21 NOTE — ED Triage Notes (Addendum)
Presents with family from PCP with chest pain and SOB   Ext swelling  headache

## 2018-10-15 ENCOUNTER — Emergency Department
Admission: EM | Admit: 2018-10-15 | Discharge: 2018-10-15 | Disposition: A | Discharge status: Home / Self Care | Payer: No Typology Code available for payment source | Attending: Student in an Organized Health Care Education/Training Program | Admitting: Student in an Organized Health Care Education/Training Program

## 2018-10-15 ENCOUNTER — Emergency Department: Payer: No Typology Code available for payment source

## 2018-10-15 ENCOUNTER — Other Ambulatory Visit: Payer: Self-pay

## 2018-10-15 ENCOUNTER — Encounter: Payer: Self-pay | Admitting: *Deleted

## 2018-10-15 DIAGNOSIS — I10 Essential (primary) hypertension: Secondary | ICD-10-CM | POA: Insufficient documentation

## 2018-10-15 DIAGNOSIS — Z79899 Other long term (current) drug therapy: Secondary | ICD-10-CM | POA: Insufficient documentation

## 2018-10-15 DIAGNOSIS — M7989 Other specified soft tissue disorders: Secondary | ICD-10-CM | POA: Insufficient documentation

## 2018-10-15 DIAGNOSIS — F17210 Nicotine dependence, cigarettes, uncomplicated: Secondary | ICD-10-CM | POA: Insufficient documentation

## 2018-10-15 LAB — HEPATIC FUNCTION PANEL
ALT: 18 U/L (ref 0–44)
AST: 15 U/L (ref 15–41)
Albumin: 3.9 g/dL (ref 3.5–5.0)
Alkaline Phosphatase: 79 U/L (ref 38–126)
Bilirubin, Direct: 0.1 mg/dL (ref 0.0–0.2)
Indirect Bilirubin: 0.5 mg/dL (ref 0.3–0.9)
Total Bilirubin: 0.6 mg/dL (ref 0.3–1.2)
Total Protein: 7.4 g/dL (ref 6.5–8.1)

## 2018-10-15 LAB — URINALYSIS, COMPLETE (UACMP) WITH MICROSCOPIC
Bacteria, UA: NONE SEEN
Bilirubin Urine: NEGATIVE
Glucose, UA: NEGATIVE mg/dL
Hgb urine dipstick: NEGATIVE
Ketones, ur: NEGATIVE mg/dL
Leukocytes,Ua: NEGATIVE
Nitrite: NEGATIVE
Protein, ur: NEGATIVE mg/dL
Specific Gravity, Urine: 1.01 (ref 1.005–1.030)
pH: 6 (ref 5.0–8.0)

## 2018-10-15 LAB — BASIC METABOLIC PANEL
Anion gap: 8 (ref 5–15)
BUN: 9 mg/dL (ref 6–20)
CO2: 25 mmol/L (ref 22–32)
Calcium: 9.2 mg/dL (ref 8.9–10.3)
Chloride: 106 mmol/L (ref 98–111)
Creatinine, Ser: 1.24 mg/dL (ref 0.61–1.24)
GFR calc Af Amer: >60 mL/min (ref >60)
GFR calc non Af Amer: >60 mL/min (ref >60)
Glucose, Bld: 120 mg/dL — ABNORMAL HIGH (ref 70–99)
Potassium: 3.9 mmol/L (ref 3.5–5.1)
Sodium: 139 mmol/L (ref 135–145)

## 2018-10-15 LAB — CBC
HCT: 43.7 % (ref 39.0–52.0)
Hemoglobin: 15.3 g/dL (ref 13.0–17.0)
MCH: 29.9 pg (ref 26.0–34.0)
MCHC: 35 g/dL (ref 30.0–36.0)
MCV: 85.4 fL (ref 80.0–100.0)
Platelets: 239 10*3/uL (ref 150–400)
RBC: 5.12 MIL/uL (ref 4.22–5.81)
RDW: 13.1 % (ref 11.5–15.5)
WBC: 5.6 10*3/uL (ref 4.0–10.5)
nRBC: 0 % (ref 0.0–0.2)

## 2018-10-15 MED ORDER — HYDROCHLOROTHIAZIDE 10 MG/ML ORAL SUSPENSION
6.2500 mg | Freq: Every day | ORAL | Status: DC
  Filled 2018-10-15: qty 1.25

## 2018-10-15 MED ORDER — HYDROCHLOROTHIAZIDE 12.5 MG PO CAPS: 12.5000 mg | ORAL_CAPSULE | Freq: Every day | ORAL | 0 refills | Status: AC

## 2018-10-15 MED ORDER — HYDROCHLOROTHIAZIDE 12.5 MG PO CAPS
12.5000 mg | ORAL_CAPSULE | Freq: Once | ORAL | Status: AC
  Administered 2018-10-15: 12.5 mg via ORAL

## 2018-10-15 NOTE — ED Notes (Signed)
Patient states his left leg is more swollen today and has a tight feeling. Per patient the swelling usually goes away when he wears his compression socks and keeps leg elevated but has not worked this time and was worrying him.

## 2018-10-15 NOTE — ED Notes (Signed)
Ultrasound in with patient

## 2018-10-15 NOTE — ED Notes (Signed)
Ultrasound is complete.

## 2018-10-15 NOTE — ED Triage Notes (Signed)
Pt ambulatory to triage.  Pt has swelling to left lower leg for 1 week.  No pain.  No known injury to leg.  No chest pain or sob.  Pt alert  Speech clear.

## 2018-10-15 NOTE — ED Provider Notes (Signed)
[facility] Emergency Department Provider Note    First MD Initiated Contact with Patient 10/15/18 1943     (approximate)  I have reviewed the triage vital signs and the nursing notes.   HISTORY  Chief Complaint Leg Swelling    HPI Todd Contreras is a 38 y.o. male no significant past medical history presents the ER for progressively worsening left lower extremity swelling.  Denies any shortness of breath orthopnea.  Denies any history of DVT or leg swelling.  Denies any trauma.  Does not have any personal history of hypertension or kidney disease.  No history of heart failure.  He denies any chest pain.  No nausea or vomiting.    No past medical history on file. No family history on file. Past Surgical History:  Procedure Laterality Date  . tubes in ears     childhood   There are no active problems to display for this patient.     Prior to Admission medications   Medication Sig Start Date End Date Taking? Authorizing Provider  albuterol (PROVENTIL HFA;VENTOLIN HFA) 108 (90 Base) MCG/ACT inhaler Inhale 2 puffs into the lungs every 6 (six) hours as needed for wheezing or shortness of breath. 05/21/17   Willy Eddyobinson, Malacai Grantz, MD  ciprofloxacin (CIPRO) 500 MG tablet Take 1 tablet (500 mg total) by mouth 2 (two) times daily. 05/27/13   Ivery QualeBryant, Hobson, PA-C  diclofenac (VOLTAREN) 75 MG EC tablet Take 1 tablet (75 mg total) by mouth 2 (two) times daily. 05/27/13   Ivery QualeBryant, Hobson, PA-C  guaiFENesin-dextromethorphan (ROBITUSSIN DM) 100-10 MG/5ML syrup Take 5 mLs by mouth every 4 (four) hours as needed for cough. 05/21/17   Willy Eddyobinson, Lynett Brasil, MD  hydrochlorothiazide (MICROZIDE) 12.5 MG capsule Take 1 capsule (12.5 mg total) by mouth daily. 10/15/18 10/15/19  Willy Eddyobinson, Mariafernanda Hendricksen, MD  phentermine 37.5 MG capsule Take 37.5 mg by mouth daily.    [provider]  sulfamethoxazole-trimethoprim (SEPTRA DS) 800-160 MG per tablet Take 1 tablet by mouth 2 (two)  times daily. 05/27/13   Ivery QualeBryant, Hobson, PA-C  SUMAtriptan (IMITREX) 50 MG tablet Take 1 tablet (50 mg total) by mouth once as needed for headache. May repeat in 2 hours if headache persists or recurs. 08/30/15 08/29/16  Loleta RoseForbach, Cory, MD    Allergies Patient has no known allergies.    Social History Social History   Tobacco Use  . Smoking status: Current Some Day Smoker    Types: Cigarettes  . Smokeless tobacco: Never Used  Substance Use Topics  . Alcohol use: Not Currently  . Drug use: No    Review of Systems Patient denies headaches, rhinorrhea, blurry vision, numbness, shortness of breath, chest pain, edema, cough, abdominal pain, nausea, vomiting, diarrhea, dysuria, fevers, rashes or hallucinations unless otherwise stated above in HPI. ____________________________________________   PHYSICAL EXAM:  VITAL SIGNS: Vitals:   10/15/18 1935 10/15/18 2200  BP: (!) 140/106 (!) 152/116  Pulse: 66   Resp: 18   Temp: 98.2 F (36.8 C)   SpO2: 97%     Constitutional: Alert and oriented.  Eyes: Conjunctivae are normal.  Head: Atraumatic. Nose: No congestion/rhinnorhea. Mouth/Throat: Mucous membranes are moist.   Neck: No stridor. Painless ROM.  Cardiovascular: Normal rate, regular rhythm. Grossly normal heart sounds.  Good peripheral circulation. Respiratory: Normal respiratory effort.  No retractions. Lungs CTAB. Gastrointestinal: Soft and nontender. No distention. No abdominal bruits. No CVA tenderness. Genitourinary: deferred Musculoskeletal: No lower extremity tenderness.  L>R pitting edema.  No joint effusions.  Neurologic:  Normal speech and language. No gross focal neurologic deficits are appreciated. No facial droop Skin:  Skin is warm, dry and intact. No rash noted. Psychiatric: Mood and affect are normal. Speech and behavior are normal.  ____________________________________________   LABS (all labs ordered are listed, but only abnormal results are displayed)   Results for orders placed or performed during the hospital encounter of 10/15/18 (from the past 24 hour(s))  Basic metabolic panel     Status: Abnormal   Collection Time: 10/15/18  4:09 PM  Result Value Ref Range   Sodium 139 135 - 145 mmol/L   Potassium 3.9 3.5 - 5.1 mmol/L   Chloride 106 98 - 111 mmol/L   CO2 25 22 - 32 mmol/L   Glucose, Bld 120 (H) 70 - 99 mg/dL   BUN 9 6 - 20 mg/dL   Creatinine, Ser 1.24 0.61 - 1.24 mg/dL   Calcium 9.2 8.9 - 10.3 mg/dL   GFR calc non Af Amer >60 >60 mL/min   GFR calc Af Amer >60 >60 mL/min   Anion gap 8 5 - 15  CBC     Status: None   Collection Time: 10/15/18  4:09 PM  Result Value Ref Range   WBC 5.6 4.0 - 10.5 K/uL   RBC 5.12 4.22 - 5.81 MIL/uL   Hemoglobin 15.3 13.0 - 17.0 g/dL   HCT 43.7 39.0 - 52.0 %   MCV 85.4 80.0 - 100.0 fL   MCH 29.9 26.0 - 34.0 pg   MCHC 35.0 30.0 - 36.0 g/dL   RDW 13.1 11.5 - 15.5 %   Platelets 239 150 - 400 K/uL   nRBC 0.0 0.0 - 0.2 %  Hepatic function panel     Status: None   Collection Time: 10/15/18  4:09 PM  Result Value Ref Range   Total Protein 7.4 6.5 - 8.1 g/dL   Albumin 3.9 3.5 - 5.0 g/dL   AST 15 15 - 41 U/L   ALT 18 0 - 44 U/L   Alkaline Phosphatase 79 38 - 126 U/L   Total Bilirubin 0.6 0.3 - 1.2 mg/dL   Bilirubin, Direct 0.1 0.0 - 0.2 mg/dL   Indirect Bilirubin 0.5 0.3 - 0.9 mg/dL  Urinalysis, Complete w Microscopic     Status: Abnormal   Collection Time: 10/15/18  8:40 PM  Result Value Ref Range   Color, Urine YELLOW (A) YELLOW   APPearance CLEAR (A) CLEAR   Specific Gravity, Urine 1.010 1.005 - 1.030   pH 6.0 5.0 - 8.0   Glucose, UA NEGATIVE NEGATIVE mg/dL   Hgb urine dipstick NEGATIVE NEGATIVE   Bilirubin Urine NEGATIVE NEGATIVE   Ketones, ur NEGATIVE NEGATIVE mg/dL   Protein, ur NEGATIVE NEGATIVE mg/dL   Nitrite NEGATIVE NEGATIVE   Leukocytes,Ua NEGATIVE NEGATIVE   RBC / HPF 0-5 0 - 5 RBC/hpf   WBC, UA 0-5 0 - 5 WBC/hpf   Bacteria, UA NONE SEEN NONE SEEN   Squamous Epithelial /  LPF 0-5 0 - 5   Mucus PRESENT    ____________________________________________ ____________________________________________  RADIOLOGY  I personally reviewed all radiographic images ordered to evaluate for the above acute complaints and reviewed radiology reports and findings.  These findings were personally discussed with the patient.  Please see medical record for radiology report.  ____________________________________________   PROCEDURES  Procedure(s) performed:  Procedures    Critical Care performed: no ____________________________________________   INITIAL IMPRESSION / ASSESSMENT AND PLAN / ED COURSE  Pertinent labs &  imaging results that were available during my care of the patient were reviewed by me and considered in my medical decision making (see chart for details).   DDX: DVT, lymphedema, nephrotic syndrome, congestive heart failure, hypertension  Todd Contreras is a 38 y.o. who presents to the ED with symptoms as described above.  Blood work and imaging sent on for the by differential.  Clinical Course as of Oct 14 2329  Wed Oct 15, 2018  2129 Blood work reassuring.  No evidence of DVT.  No evidence of nephrotic syndrome.  Will encourage continued compression stockings.  Will give short course of thiazide diuretic based on patient's blood pressure elevations and arrange follow-up with PCP.   [PR]    Clinical Course User Index [PR] Willy Eddyobinson, Kaladin Noseworthy, MD    The patient was evaluated in Emergency Department today for the symptoms described in the history of present illness. He/she was evaluated in the context of the global COVID-19 pandemic, which necessitated consideration that the patient might be at risk for infection with the SARS-CoV-2 virus that causes COVID-19. Institutional protocols and algorithms that pertain to the evaluation of patients at risk for COVID-19 are in a state of rapid change based on information released by regulatory bodies  including the CDC and federal and state organizations. These policies and algorithms were followed during the patient's care in the ED.  As part of my medical decision making, I reviewed the following data within the electronic MEDICAL RECORD NUMBER Nursing notes reviewed and incorporated, Labs reviewed, notes from prior ED visits and [place] Controlled Substance Database   ____________________________________________   FINAL CLINICAL IMPRESSION(S) / ED DIAGNOSES  Final diagnoses:  Leg swelling  Hypertension, unspecified type      NEW MEDICATIONS STARTED DURING THIS VISIT:  Discharge Medication List as of 10/15/2018  9:29 PM    START taking these medications   Details  hydrochlorothiazide (MICROZIDE) 12.5 MG capsule Take 1 capsule (12.5 mg total) by mouth daily., Starting Wed 10/15/2018, Until Thu 10/15/2019, Normal         Note:  This document was prepared using Dragon voice recognition software and may include unintentional dictation errors.    Willy Eddyobinson, Tyrina Hines, MD 10/15/18 [phone]

## 2021-06-08 ENCOUNTER — Emergency Department: Payer: No Typology Code available for payment source

## 2021-06-08 ENCOUNTER — Other Ambulatory Visit: Payer: Self-pay

## 2021-06-08 ENCOUNTER — Emergency Department
Admission: EM | Admit: 2021-06-08 | Discharge: 2021-06-08 | Disposition: A | Discharge status: Home / Self Care | Payer: No Typology Code available for payment source | Attending: Emergency Medicine | Admitting: Emergency Medicine

## 2021-06-08 ENCOUNTER — Encounter: Payer: Self-pay | Admitting: Emergency Medicine

## 2021-06-08 DIAGNOSIS — R519 Headache, unspecified: Secondary | ICD-10-CM | POA: Diagnosis present

## 2021-06-08 DIAGNOSIS — J0100 Acute maxillary sinusitis, unspecified: Secondary | ICD-10-CM | POA: Insufficient documentation

## 2021-06-08 DIAGNOSIS — Z20822 Contact with and (suspected) exposure to covid-19: Secondary | ICD-10-CM | POA: Insufficient documentation

## 2021-06-08 DIAGNOSIS — R1013 Epigastric pain: Secondary | ICD-10-CM | POA: Insufficient documentation

## 2021-06-08 LAB — COMPREHENSIVE METABOLIC PANEL
ALT: 27 U/L (ref 0–44)
AST: 23 U/L (ref 15–41)
Albumin: 4 g/dL (ref 3.5–5.0)
Alkaline Phosphatase: 102 U/L (ref 38–126)
Anion gap: 8 (ref 5–15)
BUN: 12 mg/dL (ref 6–20)
CO2: 22 mmol/L (ref 22–32)
Calcium: 8.7 mg/dL — ABNORMAL LOW (ref 8.9–10.3)
Chloride: 105 mmol/L (ref 98–111)
Creatinine, Ser: 1.09 mg/dL (ref 0.61–1.24)
GFR, Estimated: >60 mL/min (ref >60)
Glucose, Bld: 109 mg/dL — ABNORMAL HIGH (ref 70–99)
Potassium: 3.9 mmol/L (ref 3.5–5.1)
Sodium: 135 mmol/L (ref 135–145)
Total Bilirubin: 0.6 mg/dL (ref 0.3–1.2)
Total Protein: 7.9 g/dL (ref 6.5–8.1)

## 2021-06-08 LAB — URINALYSIS, ROUTINE W REFLEX MICROSCOPIC
Bilirubin Urine: NEGATIVE
Glucose, UA: NEGATIVE mg/dL
Hgb urine dipstick: NEGATIVE
Ketones, ur: NEGATIVE mg/dL
Leukocytes,Ua: NEGATIVE
Nitrite: NEGATIVE
Protein, ur: NEGATIVE mg/dL
Specific Gravity, Urine: 1.01 (ref 1.005–1.030)
pH: 5 (ref 5.0–8.0)

## 2021-06-08 LAB — CBC
HCT: 45.4 % (ref 39.0–52.0)
Hemoglobin: 15.8 g/dL (ref 13.0–17.0)
MCH: 29.8 pg (ref 26.0–34.0)
MCHC: 34.8 g/dL (ref 30.0–36.0)
MCV: 85.7 fL (ref 80.0–100.0)
Platelets: 292 10*3/uL (ref 150–400)
RBC: 5.3 MIL/uL (ref 4.22–5.81)
RDW: 13 % (ref 11.5–15.5)
WBC: 8.8 10*3/uL (ref 4.0–10.5)
nRBC: 0 % (ref 0.0–0.2)

## 2021-06-08 LAB — RESP PANEL BY RT-PCR (FLU A&B, COVID) ARPGX2
Influenza A by PCR: NEGATIVE
Influenza B by PCR: NEGATIVE
SARS Coronavirus 2 by RT PCR: NEGATIVE

## 2021-06-08 LAB — TROPONIN I (HIGH SENSITIVITY)
Troponin I (High Sensitivity): 5 ng/L (ref <18)
Troponin I (High Sensitivity): 5 ng/L (ref <18)

## 2021-06-08 LAB — LIPASE, BLOOD: Lipase: 34 U/L (ref 11–51)

## 2021-06-08 MED ORDER — PREDNISONE 20 MG PO TABS: 40.0000 mg | ORAL_TABLET | Freq: Every day | ORAL | 0 refills | Status: AC

## 2021-06-08 MED ORDER — ACETAMINOPHEN 325 MG PO TABS
650.0000 mg | ORAL_TABLET | Freq: Once | ORAL | Status: AC
  Administered 2021-06-08: 650 mg via ORAL
  Filled 2021-06-08: qty 2

## 2021-06-08 MED ORDER — FAMOTIDINE 20 MG PO TABS: 20.0000 mg | ORAL_TABLET | Freq: Two times a day (BID) | ORAL | 0 refills | Status: AC

## 2021-06-08 MED ORDER — METOCLOPRAMIDE HCL 10 MG PO TABS
10.0000 mg | ORAL_TABLET | Freq: Once | ORAL | Status: AC
  Administered 2021-06-08: 10 mg via ORAL
  Filled 2021-06-08: qty 1

## 2021-06-08 MED ORDER — PREDNISONE 20 MG PO TABS
60.0000 mg | ORAL_TABLET | Freq: Once | ORAL | Status: AC
  Administered 2021-06-08: 60 mg via ORAL
  Filled 2021-06-08: qty 3

## 2021-06-08 MED ORDER — ONDANSETRON 4 MG PO TBDP: 4.0000 mg | ORAL_TABLET | Freq: Three times a day (TID) | ORAL | 0 refills | Status: AC | PRN

## 2021-06-08 NOTE — ED Notes (Signed)
See triage note  presents with frontal headache and congestion for about 2 weeks  was recently treated for sinus infection  also having intermittent epigastric pain afebrile on arrival

## 2021-06-08 NOTE — ED Triage Notes (Signed)
Pt here with a headache and abd pain. Pt states headache started a week and a half ago and has been constant. Pt states that abd pain is upper and centered and has also been constant. Pt denies N/V/D. Pt in NAD in triage.

## 2021-06-08 NOTE — ED Notes (Signed)
EKG performed.

## 2021-06-08 NOTE — ED Provider Notes (Signed)
[facility] Emergency Department Provider Note     Event Date/Time   First MD Initiated Contact with Patient 06/08/21 [phone]     (approximate)   History   Headache and Abdominal Pain   HPI  Todd Contreras is a 41 y.o. male presents himself to the ED for evaluation of persistent headache as well as some upper abdominal pain.  Patient reports a frontal headache has been persistent for the last week and a half.  He is currently on a course of Augmentin for a sinus infection. He has not taken any other meds for symptom relief.  Patient denies any history of persistent headache syndrome.  He describes the headache as frontal pressure.Raechel Chute he also reports some chronic, central abdominal pain has been constant but he denies any associated nausea, vomiting, or diarrhea.  Denies any cough, congestion, chest pain, or shortness of breath.     Physical Exam   Triage Vital Signs: ED Triage Vitals  Enc Vitals Group     BP 06/08/21 0717 130/79     Pulse Rate 06/08/21 0717 (!) 101     Resp 06/08/21 0717 20     Temp 06/08/21 0717 97.9 F (36.6 C)     Temp Source 06/08/21 0717 Oral     SpO2 06/08/21 0717 96 %     Weight 06/08/21 0715 (!) 380 lb (172.4 kg)     Height 06/08/21 0715 6\' 3"  (1.905 m)     Head Circumference --      Peak Flow --      Pain Score 06/08/21 0715 7     Pain Loc --      Pain Edu? --      Excl. in GC? --     Most recent vital signs: Vitals:   06/08/21 0937 06/08/21 1132  BP: (!) 143/87 140/80  Pulse: 79 70  Resp: 18 18  Temp: 97.7 F (36.5 C)   SpO2: 95% 96%    General Awake, no distress.  HEENT NCAT. PERRL. EOMI. No rhinorrhea. Mucous membranes are moist.  CV:  Good peripheral perfusion.  RESP:  Normal effort.  ABD:  No distention.   ED Results / Procedures / Treatments   Labs (all labs ordered are listed, but only abnormal results are displayed) Labs Reviewed  COMPREHENSIVE METABOLIC PANEL - Abnormal;  Notable for the following components:      Result Value   Glucose, Bld 109 (*)    Calcium 8.7 (*)    All other components within normal limits  URINALYSIS, ROUTINE W REFLEX MICROSCOPIC - Abnormal; Notable for the following components:   Color, Urine YELLOW (*)    APPearance CLEAR (*)    All other components within normal limits  RESP PANEL BY RT-PCR (FLU A&B, COVID) ARPGX2  LIPASE, BLOOD  CBC  TROPONIN I (HIGH SENSITIVITY)  TROPONIN I (HIGH SENSITIVITY)     EKG  Vent. rate 81 BPM PR interval 176 ms QRS duration 92 ms QT/QTcB 380/441 ms P-R-T axes 18 0 7 No STEMI Normal axis  RADIOLOGY  I personally viewed and evaluated these images as part of my medical decision making, as well as reviewing the written report by the radiologist.  ED Provider Interpretation: findings consistent with sinusitis, no acute abdominal findings}  CT HEAD WO CONTRAST ( )  Result Date: 06/08/2021 CLINICAL DATA:  Headache, chronic, new features or increased frequency EXAM: CT HEAD WITHOUT CONTRAST TECHNIQUE: Contiguous axial images were obtained from  the base of the skull through the vertex without intravenous contrast. RADIATION DOSE REDUCTION: This exam was performed according to the departmental dose-optimization program which includes automated exposure control, adjustment of the mA and/or kV according to patient size and/or use of iterative reconstruction technique. COMPARISON:  CT head Aug 30, 2015. FINDINGS: Brain: No evidence of acute infarction, hemorrhage, hydrocephalus, extra-axial collection or mass lesion/mass effect. Vascular: No hyperdense vessel identified. Skull: No acute fracture. Sinuses/Orbits: Moderate mucosal thickening the left maxillary sinus with frothy secretions and air-fluid level. Moderate mucosal thickening of scattered ethmoid air cells. Other: No mastoid effusions. IMPRESSION: 1. No evidence of acute intracranial abnormality. 2. Predominately ethmoid air cell and left  maxillary sinus mucosal thickening with frothy secretions and air-fluid level. Correlate with signs/symptoms of sinusitis. Electronically Signed   By: Margaretha Sheffield M.D.   On: 06/08/2021 09:44   US Abdomen Limited RUQ (LIVER/GB)  Result Date: 06/08/2021 CLINICAL DATA:  Epigastric pain abdominal pain EXAM: ULTRASOUND ABDOMEN LIMITED RIGHT UPPER QUADRANT COMPARISON:  None. FINDINGS: Gallbladder: No gallstones or wall thickening visualized. No sonographic Murphy sign noted by sonographer. Common bile duct: Diameter: Normal at 4 mm Liver: No focal lesion identified. Within normal limits in parenchymal echogenicity. Portal vein is patent on color Doppler imaging with normal direction of blood flow towards the liver. Other: None. IMPRESSION: Normal RIGHT upper quadrant ultrasound. Electronically Signed   By: Suzy Bouchard M.D.   On: 06/08/2021 11:02     PROCEDURES:  Critical Care performed: No  Procedures   MEDICATIONS ORDERED IN ED: Medications  acetaminophen (TYLENOL) tablet 650 mg (650 mg Oral Given 06/08/21 0752)  metoCLOPramide (REGLAN) tablet 10 mg (10 mg Oral Given 06/08/21 0752)  predniSONE (DELTASONE) tablet 60 mg (60 mg Oral Given 06/08/21 0752)     IMPRESSION / MDM / ASSESSMENT AND PLAN / ED COURSE  I reviewed the triage vital signs and the nursing notes.                              Differential diagnosis includes, but is not limited to, ACS, aortic dissection, pulmonary embolism, cardiac tamponade, pneumothorax, pneumonia, pericarditis, myocarditis, GI-related causes including esophagitis/gastritis, and musculoskeletal chest wall pain, intracranial hemorrhage, meningitis/encephalitis, previous head trauma, cavernous venous thrombosis, tension headache, temporal arteritis, migraine or migraine equivalent, idiopathic intracranial hypertension, and non-specific headache.   Patient ED evaluation of persistent headache for 2 weeks with current diagnosis and treatment for acute  sinusitis.  He also endorses some unrelated epigastric abdominal discomfort.  Patient is a medically complex, found have a reassuring work-up.  Vital signs are stable, and no signs of acute neuromuscular deficit or acute abdominal process.  Patient is further evaluated with labs which are normal and the fact that his CBC is without leukocyturia or critical anemia, no BMP evidence of electrolyte abnormality or abnormal liver function, and troponin is negative x2.  CT imaging of the head is negative for any acute intra cranial process, but does show some mild ethmoid and maxillary sinus thickening with air-fluid levels.  Abdominal ultrasound is negative for any acute gallstones, cholecystitis, or hepatomegaly.  Patient's diagnosis is consistent with acute sinusitis with associated headache and chronic abdominal pain likely due to GERD. Patient will be discharged home with prescriptions for famotidine, Zofran, prednisone.  He will be discharged with instructions to continue with the previously prescribed Augmentin.  He will follow-up with ENT or his primary provider for ongoing symptoms.  Patient reports  improvement of his symptoms at the conclusion of his follow-up.  He is reassured by his negative work-up at this time.  Patient stable for discharge without any further indication for admission based on his overall negative work-up.  FINAL CLINICAL IMPRESSION(S) / ED DIAGNOSES   Final diagnoses:  Epigastric abdominal pain  Acute non-recurrent maxillary sinusitis     Rx / DC Orders   ED Discharge Orders          Ordered    predniSONE (DELTASONE) 20 MG tablet  Daily with breakfast        06/08/21 1121    ondansetron (ZOFRAN-ODT) 4 MG disintegrating tablet  Every 8 hours PRN        06/08/21 1121    famotidine (PEPCID) 20 MG tablet  2 times daily        06/08/21 1121             Note:  This document was prepared using Dragon voice recognition software and may include unintentional dictation  errors.    Melvenia Needles, PA-C 06/08/21 1606    Naaman Plummer, MD 06/09/21 [phone]

## 2021-06-08 NOTE — Discharge Instructions (Addendum)
Your exam, labs, EKG, CT scan, and ultrasound are all normal and reassuring at this time but no signs of serious intracranial process.  The CT did confirm your current diagnosis of sinusitis.  Continue to take antibiotics as prescribed.  Your ultrasound is negative for any gallstones or pancreatitis.  You should take the famotidine daily as directed.  Follow-up with primary provider or GI specialist for ongoing symptoms.  Return to the ED if needed.

## 2022-02-26 ENCOUNTER — Emergency Department: Payer: PRIVATE HEALTH INSURANCE

## 2022-02-26 ENCOUNTER — Other Ambulatory Visit: Payer: Self-pay

## 2022-02-26 ENCOUNTER — Emergency Department
Admission: EM | Admit: 2022-02-26 | Discharge: 2022-02-26 | Disposition: A | Discharge status: Home / Self Care | Payer: PRIVATE HEALTH INSURANCE | Attending: Emergency Medicine | Admitting: Emergency Medicine

## 2022-02-26 ENCOUNTER — Encounter: Payer: Self-pay | Admitting: Emergency Medicine

## 2022-02-26 DIAGNOSIS — I10 Essential (primary) hypertension: Secondary | ICD-10-CM | POA: Insufficient documentation

## 2022-02-26 DIAGNOSIS — J181 Lobar pneumonia, unspecified organism: Secondary | ICD-10-CM | POA: Diagnosis not present

## 2022-02-26 DIAGNOSIS — Z1152 Encounter for screening for COVID-19: Secondary | ICD-10-CM | POA: Insufficient documentation

## 2022-02-26 DIAGNOSIS — J189 Pneumonia, unspecified organism: Secondary | ICD-10-CM

## 2022-02-26 DIAGNOSIS — R071 Chest pain on breathing: Secondary | ICD-10-CM | POA: Diagnosis present

## 2022-02-26 LAB — CBC WITH DIFFERENTIAL/PLATELET
Abs Immature Granulocytes: 0.01 10*3/uL (ref 0.00–0.07)
Basophils Absolute: 0.1 10*3/uL (ref 0.0–0.1)
Basophils Relative: 1 %
Eosinophils Absolute: 0.2 10*3/uL (ref 0.0–0.5)
Eosinophils Relative: 3 %
HCT: 40.5 % (ref 39.0–52.0)
Hemoglobin: 14.1 g/dL (ref 13.0–17.0)
Immature Granulocytes: 0 %
Lymphocytes Relative: 31 %
Lymphs Abs: 2.2 10*3/uL (ref 0.7–4.0)
MCH: 29 pg (ref 26.0–34.0)
MCHC: 34.8 g/dL (ref 30.0–36.0)
MCV: 83.2 fL (ref 80.0–100.0)
Monocytes Absolute: 0.6 10*3/uL (ref 0.1–1.0)
Monocytes Relative: 8 %
Neutro Abs: 4.3 10*3/uL (ref 1.7–7.7)
Neutrophils Relative %: 57 %
Platelets: 256 10*3/uL (ref 150–400)
RBC: 4.87 MIL/uL (ref 4.22–5.81)
RDW: 13.2 % (ref 11.5–15.5)
WBC: 7.3 10*3/uL (ref 4.0–10.5)
nRBC: 0 % (ref 0.0–0.2)

## 2022-02-26 LAB — COMPREHENSIVE METABOLIC PANEL
ALT: 14 U/L (ref 0–44)
AST: 14 U/L — ABNORMAL LOW (ref 15–41)
Albumin: 3.7 g/dL (ref 3.5–5.0)
Alkaline Phosphatase: 94 U/L (ref 38–126)
Anion gap: 6 (ref 5–15)
BUN: 11 mg/dL (ref 6–20)
CO2: 22 mmol/L (ref 22–32)
Calcium: 8.8 mg/dL — ABNORMAL LOW (ref 8.9–10.3)
Chloride: 106 mmol/L (ref 98–111)
Creatinine, Ser: 1.08 mg/dL (ref 0.61–1.24)
GFR, Estimated: >60 mL/min (ref >60)
Glucose, Bld: 99 mg/dL (ref 70–99)
Potassium: 3.7 mmol/L (ref 3.5–5.1)
Sodium: 134 mmol/L — ABNORMAL LOW (ref 135–145)
Total Bilirubin: 0.9 mg/dL (ref 0.3–1.2)
Total Protein: 7.4 g/dL (ref 6.5–8.1)

## 2022-02-26 LAB — LIPASE, BLOOD: Lipase: 27 U/L (ref 11–51)

## 2022-02-26 LAB — URINALYSIS, ROUTINE W REFLEX MICROSCOPIC
Bilirubin Urine: NEGATIVE
Glucose, UA: NEGATIVE mg/dL
Hgb urine dipstick: NEGATIVE
Ketones, ur: NEGATIVE mg/dL
Leukocytes,Ua: NEGATIVE
Nitrite: NEGATIVE
Protein, ur: NEGATIVE mg/dL
Specific Gravity, Urine: 1.012 (ref 1.005–1.030)
pH: 5 (ref 5.0–8.0)

## 2022-02-26 LAB — TROPONIN I (HIGH SENSITIVITY): Troponin I (High Sensitivity): 3 ng/L (ref <18)

## 2022-02-26 LAB — RESP PANEL BY RT-PCR (FLU A&B, COVID) ARPGX2
Influenza A by PCR: NEGATIVE
Influenza B by PCR: NEGATIVE
SARS Coronavirus 2 by RT PCR: NEGATIVE

## 2022-02-26 LAB — BRAIN NATRIURETIC PEPTIDE: B Natriuretic Peptide: 15.1 pg/mL (ref 0.0–100.0)

## 2022-02-26 LAB — D-DIMER, QUANTITATIVE: D-Dimer, Quant: 0.73 ug/mL-FEU — ABNORMAL HIGH (ref 0.00–0.50)

## 2022-02-26 MED ORDER — AMOXICILLIN-POT CLAVULANATE 875-125 MG PO TABS
1.0000 | ORAL_TABLET | Freq: Once | ORAL | Status: AC
  Administered 2022-02-26: 1 via ORAL
  Filled 2022-02-26: qty 1

## 2022-02-26 MED ORDER — IOHEXOL 350 MG/ML SOLN: 75.0000 mL | Freq: Once | INTRAVENOUS | Status: DC | PRN

## 2022-02-26 MED ORDER — IBUPROFEN 600 MG PO TABS: 600.0000 mg | ORAL_TABLET | Freq: Four times a day (QID) | ORAL | 0 refills | Status: AC | PRN

## 2022-02-26 MED ORDER — OXYCODONE HCL 5 MG PO TABS
5.0000 mg | ORAL_TABLET | Freq: Once | ORAL | Status: AC
  Administered 2022-02-26: 5 mg via ORAL
  Filled 2022-02-26: qty 1

## 2022-02-26 MED ORDER — ACETAMINOPHEN 500 MG PO TABS
1000.0000 mg | ORAL_TABLET | Freq: Once | ORAL | Status: AC
  Administered 2022-02-26: 1000 mg via ORAL
  Filled 2022-02-26: qty 2

## 2022-02-26 MED ORDER — DOXYCYCLINE HYCLATE 100 MG PO TABS
100.0000 mg | ORAL_TABLET | Freq: Once | ORAL | Status: AC
  Administered 2022-02-26: 100 mg via ORAL
  Filled 2022-02-26: qty 1

## 2022-02-26 MED ORDER — IBUPROFEN 600 MG PO TABS: 600.0000 mg | ORAL_TABLET | Freq: Four times a day (QID) | ORAL | 0 refills | Status: DC | PRN

## 2022-02-26 MED ORDER — LIDOCAINE 5 % EX PTCH
1.0000 | MEDICATED_PATCH | CUTANEOUS | Status: DC
  Administered 2022-02-26: 1 via TRANSDERMAL
  Filled 2022-02-26: qty 1

## 2022-02-26 MED ORDER — BENZONATATE 100 MG PO CAPS: 100.0000 mg | ORAL_CAPSULE | Freq: Three times a day (TID) | ORAL | 0 refills | Status: AC | PRN

## 2022-02-26 MED ORDER — DOXYCYCLINE MONOHYDRATE 100 MG PO TABS: 100.0000 mg | ORAL_TABLET | Freq: Two times a day (BID) | ORAL | 0 refills | Status: AC

## 2022-02-26 MED ORDER — BENZONATATE 100 MG PO CAPS: 100.0000 mg | ORAL_CAPSULE | Freq: Three times a day (TID) | ORAL | 0 refills | Status: DC | PRN

## 2022-02-26 MED ORDER — IOHEXOL 350 MG/ML SOLN
100.0000 mL | Freq: Once | INTRAVENOUS | Status: AC | PRN
  Administered 2022-02-26: 100 mL via INTRAVENOUS

## 2022-02-26 MED ORDER — AMOXICILLIN-POT CLAVULANATE 875-125 MG PO TABS: 1.0000 | ORAL_TABLET | Freq: Two times a day (BID) | ORAL | 0 refills | Status: AC

## 2022-02-26 NOTE — ED Triage Notes (Signed)
C/O painful inspiration x 1 day.   AAOx3.  Skin warm and dry. NAD

## 2022-02-26 NOTE — Discharge Instructions (Addendum)
You can take the ibuprofen with food with Tylenol 1 g every 8 hours with the 2 antibiotics.  Return to the ER if develop worsening shortness of breath or any other concerns  IMPRESSION:  1. No evidence of pulmonary embolus.  2. Trace right pleural effusion.  3. Right basilar consolidation which may reflect a combination of  pneumonia and atelectasis.  4. Chronic elevation of the right hemidiaphragm.  5. Mild cardiomegaly.

## 2022-02-26 NOTE — ED Provider Notes (Signed)
[facility] Provider Note    Event Date/Time   First MD Initiated Contact with Patient 02/26/22 1532     (approximate)   History   painful inspiration   HPI  Todd Contreras is a 41 y.o. male  who comes in with painful inspiration x1 day.  Patient has a history of hypertension as well as obesity.  He reports having pain since yesterday breathing on the right side of his chest going into his right upper back.  He denies any coughing up blood but does report some intermittent swelling in his legs that he was previously on Lasix for.  He does report that his left leg is bigger than his right leg.  He denies any other risk factors for PE.  Denies any urinary symptoms.  Does report that is worse with movement.  Has not taken anything yet to try to help with the pain     Physical Exam   Triage Vital Signs: ED Triage Vitals  Enc Vitals Group     BP 02/26/22 1457 (!) 149/96     Pulse Rate 02/26/22 1457 74     Resp 02/26/22 1457 20     Temp 02/26/22 1457 99.4 F (37.4 C)     Temp Source 02/26/22 1457 Oral     SpO2 02/26/22 1457 96 %     Weight 02/26/22 1353 (!) 380 lb 1.2 oz (172.4 kg)     Height 02/26/22 1353 6\' 3"  (1.905 m)     Head Circumference --      Peak Flow --      Pain Score 02/26/22 1353 0     Pain Loc --      Pain Edu? --      Excl. in GC? --     Most recent vital signs: Vitals:   02/26/22 1457  BP: (!) 149/96  Pulse: 74  Resp: 20  Temp: 99.4 F (37.4 C)  SpO2: 96%     General: Awake, no distress.  CV:  Good peripheral perfusion.  Resp:  Normal effort.  Abd:  No distention.  A little bit of right upper pain going into the back. Other:  Patient has some right upper back pain.  A little bit of trace swelling noted in his legs worse on the left.   ED Results / Procedures / Treatments   Labs (all labs ordered are listed, but only abnormal results are displayed) Labs Reviewed  RESP PANEL BY RT-PCR (FLU A&B, COVID)  ARPGX2  CBC WITH DIFFERENTIAL/PLATELET  COMPREHENSIVE METABOLIC PANEL  LIPASE, BLOOD  D-DIMER, QUANTITATIVE  URINALYSIS, ROUTINE W REFLEX MICROSCOPIC  BRAIN NATRIURETIC PEPTIDE  TROPONIN I (HIGH SENSITIVITY)     EKG  My interpretation of EKG:  Normal sinus rhythm 77 without any ST elevation or T wave inversions except for maybe a little bit in lead III, normal intervals  RADIOLOGY I have reviewed the xray personally and interpreted and patient has possible atelectasis versus early infiltrate in the right  PROCEDURES:  Critical Care performed: No  Procedures   MEDICATIONS ORDERED IN ED: Medications  oxyCODONE (Oxy IR/ROXICODONE) immediate release tablet 5 mg (has no administration in time range)  acetaminophen (TYLENOL) tablet 1,000 mg (has no administration in time range)  lidocaine (LIDODERM) 5 % 1 patch (has no administration in time range)     IMPRESSION / MDM / ASSESSMENT AND PLAN / ED COURSE  I reviewed the triage vital signs and the nursing notes.  Patient's presentation is most consistent with acute presentation with potential threat to life or bodily function.   Patient comes in with concerns for some leg swelling, chest discomfort worse with taking a deep breath.  Given the concern with additional leg swelling I concern about potential of PE therefore I ordered a D-dimer, labs to evaluate for ACS, ultrasound to make sure size gallbladder and ultrasound to make sure no evidence of DVT.  UA without evidence of any blood in the urine so I doubt that this is a kidney stone.  COVID, flu are negative.  CBC shows reassuring white count CMP shows reassuring creatinine lipase normal D-dimer was elevated BNP normal.  We will proceed with CT scan  IMPRESSION:  1. No evidence of pulmonary embolus.  2. Trace right pleural effusion.  3. Right basilar consolidation which may reflect a combination of  pneumonia and atelectasis.  4. Chronic elevation of the right  hemidiaphragm.  5. Mild cardiomegaly.    Reevaluated patient and updated him on results.  He expressed understanding.  Patient does smoke given the associated small effusion associated with it I will cover him with Augmentin and azithromycin.  He expressed understanding felt comfortable with discharge home.  Amatory sat was normal he understands he can return if he develops worsening shortness of breath  The patient is on the cardiac monitor to evaluate for evidence of arrhythmia and/or significant heart rate changes.      FINAL CLINICAL IMPRESSION(S) / ED DIAGNOSES   Final diagnoses:  Pneumonia of right lower lobe due to infectious organism     Rx / DC Orders   ED Discharge Orders          Ordered    amoxicillin-clavulanate (AUGMENTIN) 875-125 MG tablet  2 times daily        02/26/22 1854    doxycycline (ADOXA) 100 MG tablet  2 times daily        02/26/22 1854    ibuprofen (ADVIL) 600 MG tablet  Every 6 hours PRN        02/26/22 1854    benzonatate (TESSALON PERLES) 100 MG capsule  3 times daily PRN        02/26/22 1854             Note:  This document was prepared using Dragon voice recognition software and may include unintentional dictation errors.   Concha Se, MD 02/26/22 [phone]

## 2022-03-03 ENCOUNTER — Emergency Department
Admission: EM | Admit: 2022-03-03 | Discharge: 2022-03-03 | Disposition: A | Discharge status: Home / Self Care | Payer: PRIVATE HEALTH INSURANCE | Attending: Emergency Medicine | Admitting: Emergency Medicine

## 2022-03-03 ENCOUNTER — Other Ambulatory Visit: Payer: Self-pay

## 2022-03-03 ENCOUNTER — Emergency Department: Payer: PRIVATE HEALTH INSURANCE

## 2022-03-03 DIAGNOSIS — J181 Lobar pneumonia, unspecified organism: Secondary | ICD-10-CM | POA: Insufficient documentation

## 2022-03-03 DIAGNOSIS — J189 Pneumonia, unspecified organism: Secondary | ICD-10-CM

## 2022-03-03 DIAGNOSIS — R0602 Shortness of breath: Secondary | ICD-10-CM | POA: Diagnosis present

## 2022-03-03 HISTORY — DX: Essential (primary) hypertension: I10

## 2022-03-03 LAB — BASIC METABOLIC PANEL
Anion gap: 9 (ref 5–15)
BUN: 13 mg/dL (ref 6–20)
CO2: 22 mmol/L (ref 22–32)
Calcium: 8.8 mg/dL — ABNORMAL LOW (ref 8.9–10.3)
Chloride: 105 mmol/L (ref 98–111)
Creatinine, Ser: 1.1 mg/dL (ref 0.61–1.24)
GFR, Estimated: >60 mL/min (ref >60)
Glucose, Bld: 98 mg/dL (ref 70–99)
Potassium: 4.1 mmol/L (ref 3.5–5.1)
Sodium: 136 mmol/L (ref 135–145)

## 2022-03-03 LAB — CBC WITH DIFFERENTIAL/PLATELET
Abs Immature Granulocytes: 0.01 10*3/uL (ref 0.00–0.07)
Basophils Absolute: 0.1 10*3/uL (ref 0.0–0.1)
Basophils Relative: 1 %
Eosinophils Absolute: 0.4 10*3/uL (ref 0.0–0.5)
Eosinophils Relative: 5 %
HCT: 42 % (ref 39.0–52.0)
Hemoglobin: 14.6 g/dL (ref 13.0–17.0)
Immature Granulocytes: 0 %
Lymphocytes Relative: 47 %
Lymphs Abs: 3.3 10*3/uL (ref 0.7–4.0)
MCH: 28.7 pg (ref 26.0–34.0)
MCHC: 34.8 g/dL (ref 30.0–36.0)
MCV: 82.5 fL (ref 80.0–100.0)
Monocytes Absolute: 0.4 10*3/uL (ref 0.1–1.0)
Monocytes Relative: 6 %
Neutro Abs: 2.9 10*3/uL (ref 1.7–7.7)
Neutrophils Relative %: 41 %
Platelets: 326 10*3/uL (ref 150–400)
RBC: 5.09 MIL/uL (ref 4.22–5.81)
RDW: 13.1 % (ref 11.5–15.5)
WBC: 7 10*3/uL (ref 4.0–10.5)
nRBC: 0 % (ref 0.0–0.2)

## 2022-03-03 LAB — TROPONIN I (HIGH SENSITIVITY)
Troponin I (High Sensitivity): 4 ng/L (ref <18)
Troponin I (High Sensitivity): 4 ng/L (ref <18)

## 2022-03-03 MED ORDER — ALBUTEROL SULFATE (2.5 MG/3ML) 0.083% IN NEBU
2.5000 mg | INHALATION_SOLUTION | Freq: Once | RESPIRATORY_TRACT | Status: AC
  Administered 2022-03-03: 2.5 mg via RESPIRATORY_TRACT
  Filled 2022-03-03: qty 3

## 2022-03-03 MED ORDER — ALBUTEROL SULFATE HFA 108 (90 BASE) MCG/ACT IN AERS: 2.0000 | INHALATION_SPRAY | Freq: Four times a day (QID) | RESPIRATORY_TRACT | 0 refills | Status: AC | PRN

## 2022-03-03 NOTE — ED Triage Notes (Signed)
Pt arrives with c/o SOB that has been ongoing for a couple of weeks. Pt was diagnosed with PNA recently. Pt denies fevers. Pt endorses CP.

## 2022-03-03 NOTE — ED Provider Notes (Signed)
[facility] Provider Note  Patient Contact: 11:27 PM (approximate)   History   Shortness of Breath   HPI  Todd Contreras is a 41 y.o. male who presents the emergency department for complaints of ongoing shortness of breath.  Patient was seen in this department with a very thorough work-up 5 days ago, diagnosed with community-acquired pneumonia.  He states that he has been improving though he still feels somewhat short of breath.  He is getting close to finishing his antibiotics and wanted to make sure that everything was "okay."  Patient has had no difficulty breathing though he sometimes feels short of breath with activity or laying down.  No significant coughing.  No fevers or chills.  Patient denies any chest pain or GI complaints.     Physical Exam   Triage Vital Signs: ED Triage Vitals  Enc Vitals Group     BP 03/03/22 2027 (!) 148/98     Pulse Rate 03/03/22 2027 70     Resp 03/03/22 2027 17     Temp 03/03/22 2027 98.2 F (36.8 C)     Temp Source 03/03/22 2027 Oral     SpO2 03/03/22 2027 95 %     Weight 03/03/22 1911 (!) 380 lb (172.4 kg)     Height 03/03/22 1911 6\' 3"  (1.905 m)     Head Circumference --      Peak Flow --      Pain Score 03/03/22 1911 7     Pain Loc --      Pain Edu? --      Excl. in GC? --     Most recent vital signs: Vitals:   03/03/22 2122 03/03/22 2300  BP: (!) 144/96 (!) 140/90  Pulse: 71 70  Resp: 16 16  Temp:  98 F (36.7 C)  SpO2: 96% 96%     General: Alert and in no acute distress.  Cardiovascular:  Good peripheral perfusion Respiratory: Normal respiratory effort without tachypnea or retractions. Lungs CTAB. Good air entry to the bases with no decreased or absent breath sounds. Musculoskeletal: Full range of motion to all extremities.  Neurologic:  No gross focal neurologic deficits are appreciated.  Skin:   No rash noted Other:   ED Results / Procedures / Treatments   Labs (all labs  ordered are listed, but only abnormal results are displayed) Labs Reviewed  BASIC METABOLIC PANEL - Abnormal; Notable for the following components:      Result Value   Calcium 8.8 (*)    All other components within normal limits  CBC WITH DIFFERENTIAL/PLATELET  TROPONIN I (HIGH SENSITIVITY)  TROPONIN I (HIGH SENSITIVITY)     EKG     RADIOLOGY  I personally viewed, evaluated, and interpreted these images as part of my medical decision making, as well as reviewing the written report by the radiologist.  ED Provider Interpretation: Patient with mild right basilar atelectasis consistent with previous imaging from 5 days ago.  No other acute cardiopulmonary findings.  DG Chest 2 View  Result Date: 03/03/2022 CLINICAL DATA:  Shortness of breath EXAM: CHEST - 2 VIEW COMPARISON:  02/26/2022 FINDINGS: Mild right basilar atelectasis. Left lung is clear. No pleural effusion or pneumothorax. Heart is normal in size. Visualized osseous structures are within normal limits. IMPRESSION: Mild right basilar atelectasis. No evidence of acute cardiopulmonary disease. Electronically Signed   By: 02/28/2022 M.D.   On: 03/03/2022 19:43    PROCEDURES:  Critical Care performed:  No  Procedures   MEDICATIONS ORDERED IN ED: Medications  albuterol (PROVENTIL) (2.5 MG/3ML) 0.083% nebulizer solution 2.5 mg (2.5 mg Nebulization Given 03/03/22 2347)     IMPRESSION / MDM / ASSESSMENT AND PLAN / ED COURSE  I reviewed the triage vital signs and the nursing notes.                              Differential diagnosis includes, but is not limited to, bronchitis, pneumonia, PE, STEMI, NSTEMI, CHF  Patient's presentation is most consistent with acute presentation with potential threat to life or bodily function.   Patient's diagnosis is consistent with community-acquired pneumonia of the right lower lobe.  Patient presents emergency department with ongoing symptoms of pneumonia.  Patient was diagnosed  5 days ago.  He had a very thorough exam and work-up at that time to include angio chest, will EKG, labs.  Patient was found to have findings consistent with pneumonia.  Labs are reassuring today.  Chest x-ray of reveals ongoing what appears to be mild consolidation versus atelectasis of the right lower lobe.  This is largely unchanged from previous imaging.  This time there is no significant worsening and given the short interval I would not expect significant improvement on chest x-ray.  Vital signs are stable.  Patient states that certain activities make his chest feel tight.  I will prescribe albuterol for symptom relief of his feeling of shortness of breath.  Have a low suspicion for STEMI, NSTEMI, PE.Marland Kitchen  Concerning signs and symptoms and return precautions discussed with the patient.  Follow-up primary care as needed.  Patient is given ED precautions to return to the ED for any worsening or new symptoms.        FINAL CLINICAL IMPRESSION(S) / ED DIAGNOSES   Final diagnoses:  Community acquired pneumonia of right lower lobe of lung     Rx / DC Orders   ED Discharge Orders          Ordered    albuterol (VENTOLIN HFA) 108 (90 Base) MCG/ACT inhaler  Every 6 hours PRN        03/03/22 2344             Note:  This document was prepared using Dragon voice recognition software and may include unintentional dictation errors.   Racheal Patches, PA-C 03/04/22 Waunita Schooner    Sharyn Creamer, MD 03/04/22 1425

## 2023-07-05 IMAGING — US US ABDOMEN LIMITED
1 series · 14 of 25 positions shown · non-contrast
Comparison: None.

CLINICAL DATA: Epigastric pain abdominal pain

EXAM:
ULTRASOUND ABDOMEN LIMITED RIGHT UPPER QUADRANT

[Series 1: us abdomen limited ruq (liver/gb) · 14 of 48 slices shown]
[im 1/48]
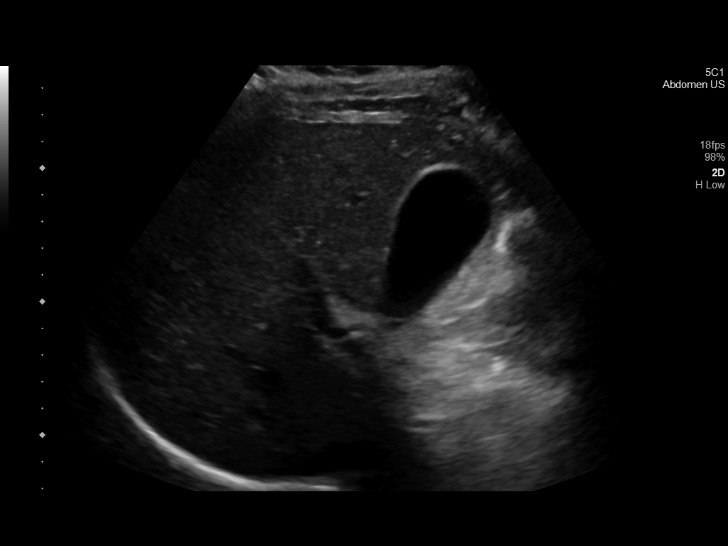
[im 4/48]
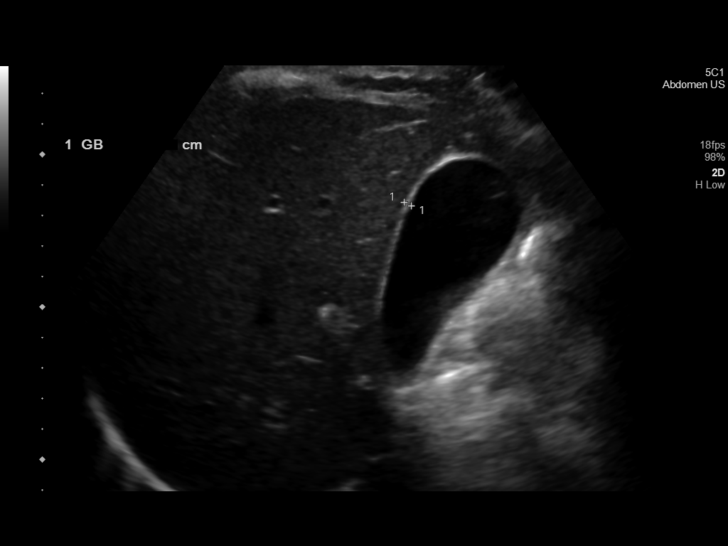
[im 8/48]
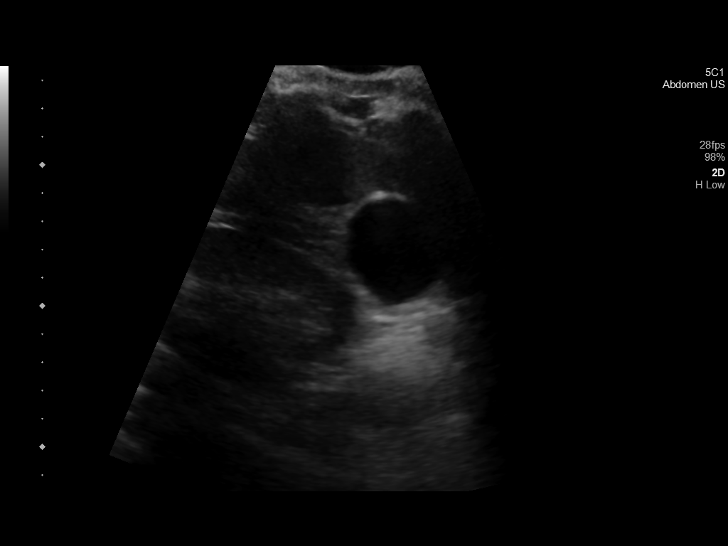
[im 12/48]
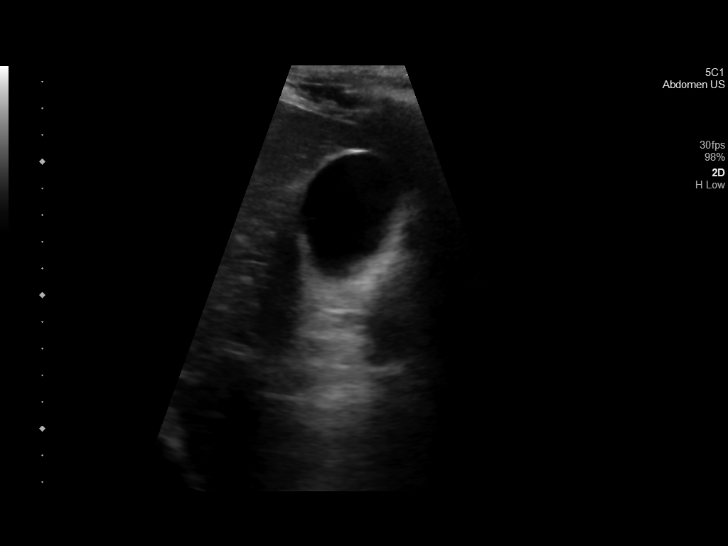
[im 16/48]
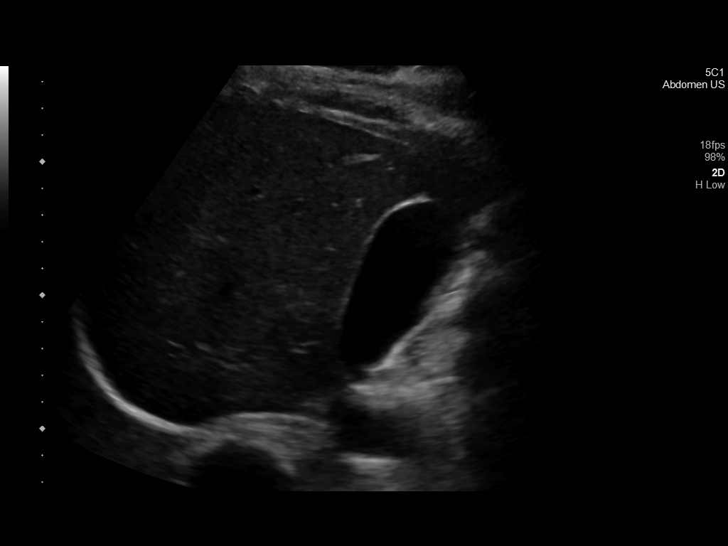
[im 18/48]
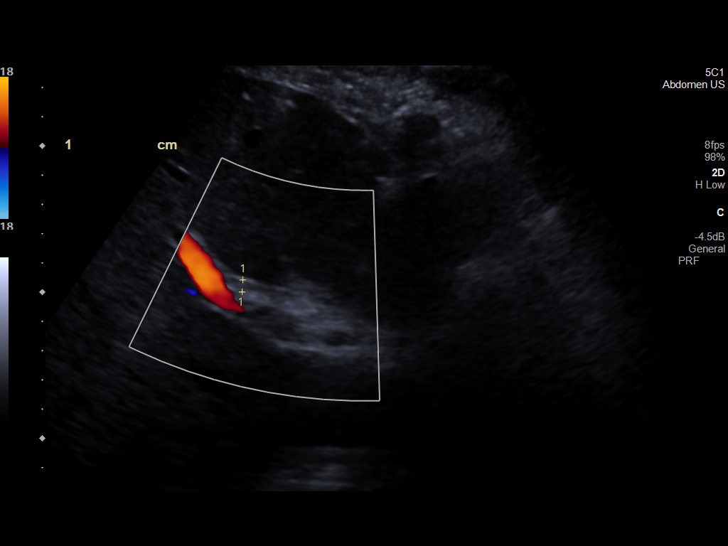
[im 22/48]
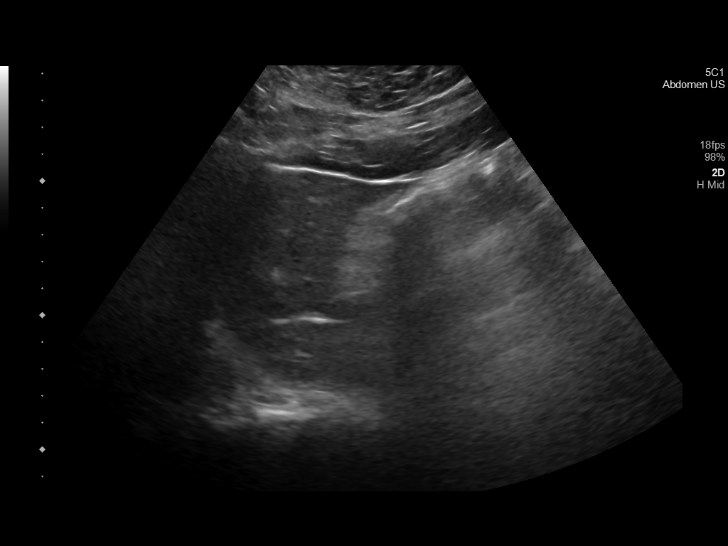
[im 26/48]
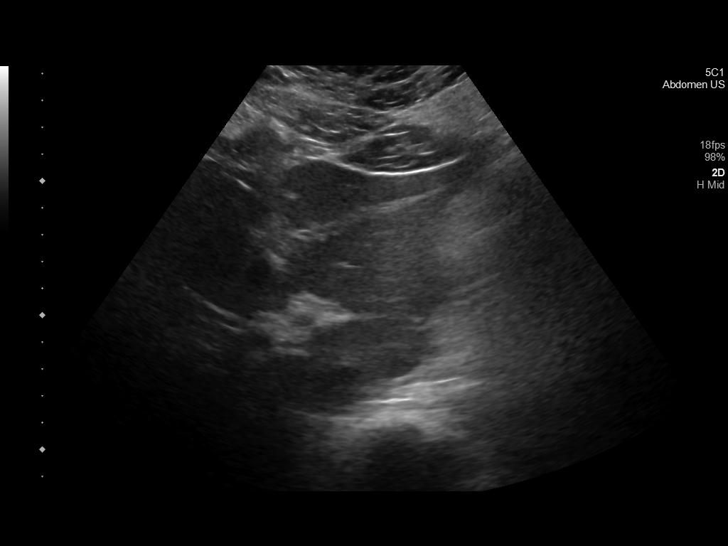
[im 30/48]
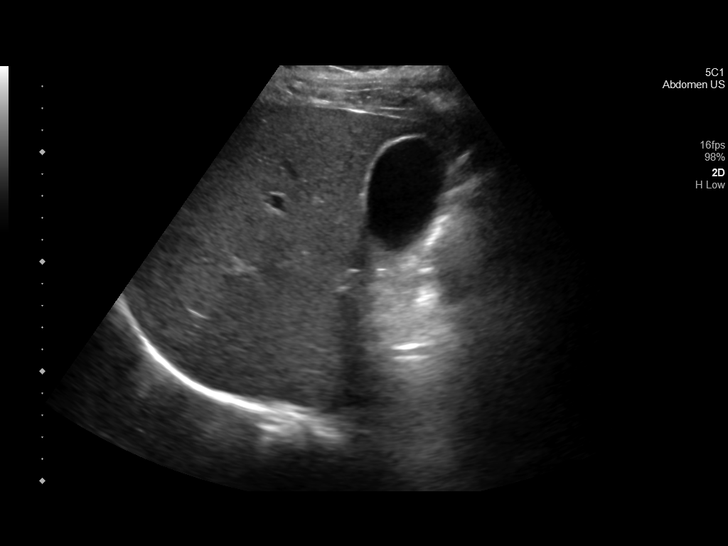
[im 32/48]
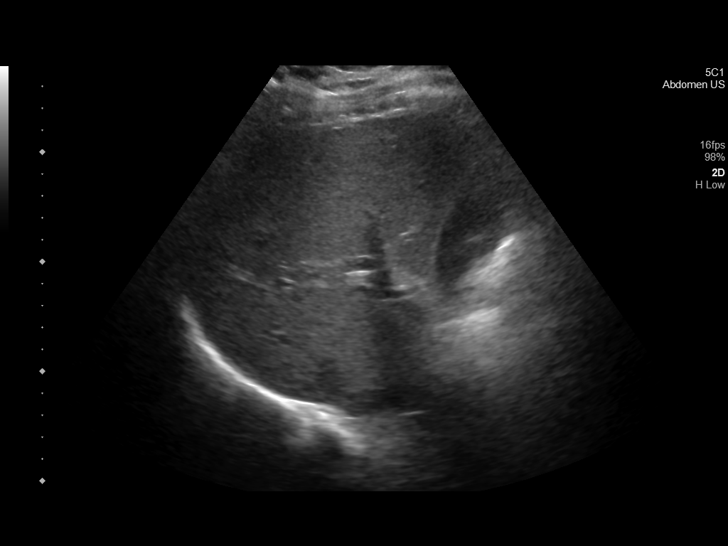
[im 36/48]
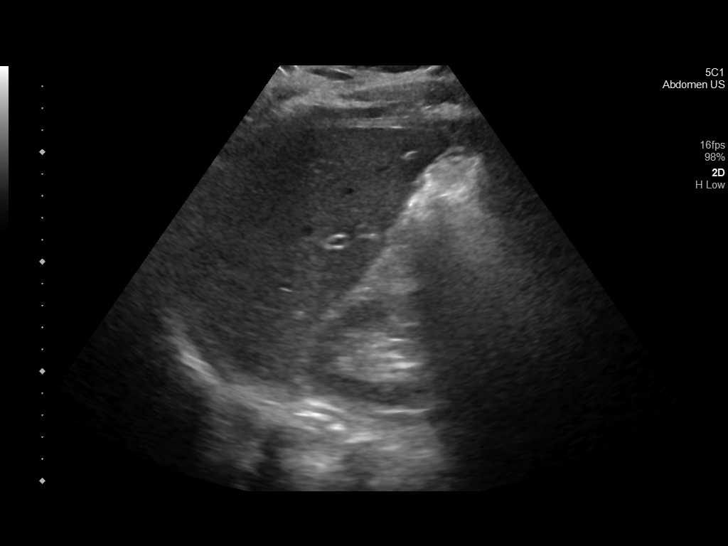
[im 40/48]
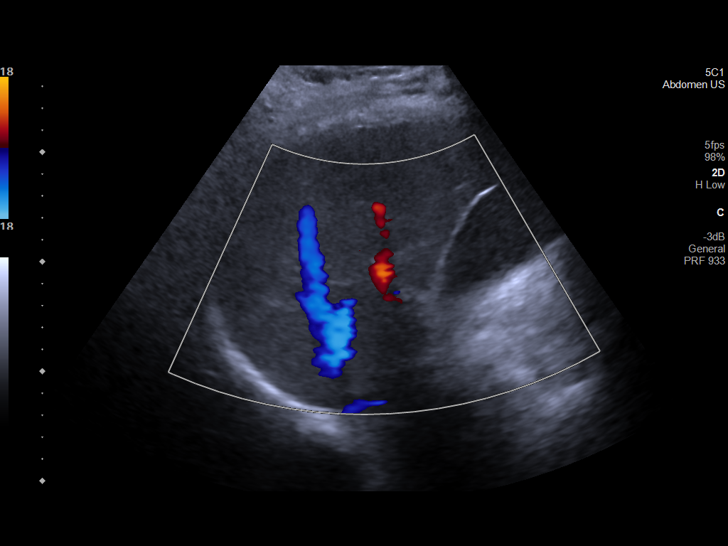
[im 44/48]
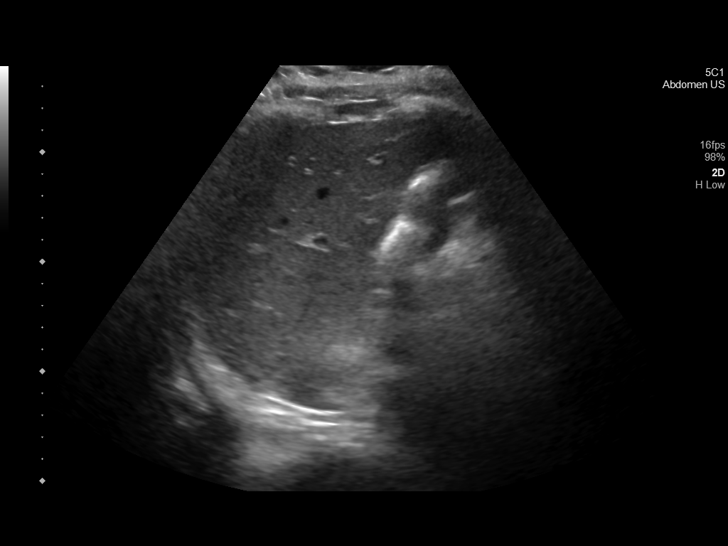
[im 48/48]
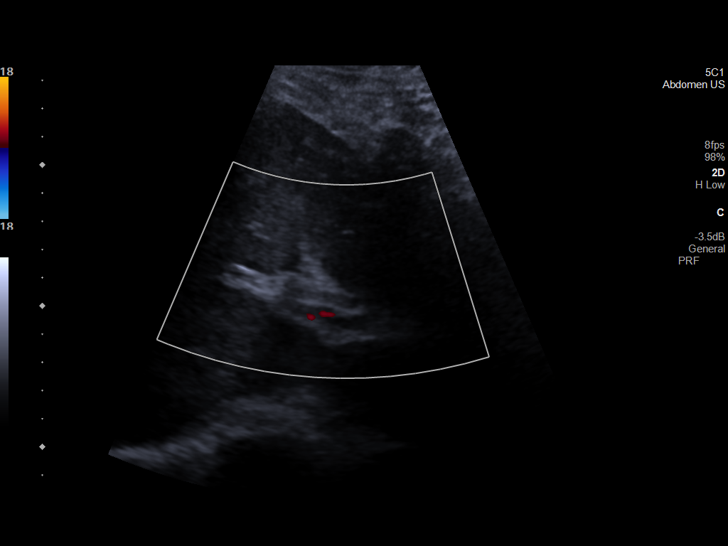

[14 of 25 positions shown; findings below may reference images not displayed]

FINDINGS: Gallbladder:

No gallstones or wall thickening visualized. No sonographic Murphy
sign noted by sonographer.

Common bile duct:

Diameter: Normal at 4 mm

Liver:

No focal lesion identified. Within normal limits in parenchymal
echogenicity. Portal vein is patent on color Doppler imaging with
normal direction of blood flow towards the liver.

Other: None.
IMPRESSION: Normal RIGHT upper quadrant ultrasound.

## 2023-07-05 IMAGING — CT CT HEAD W/O CM
4 series · 16 of 47 positions shown, 18 images · non-contrast
Comparison: CT head August 30, 2015.

CLINICAL DATA: Headache, chronic, new features or increased
frequency



[Series 2: head bone · axial · 0.45mm/px · z∈[-72,-40]mm · 3 of 81 slices shown]
[im 9/81  bone]
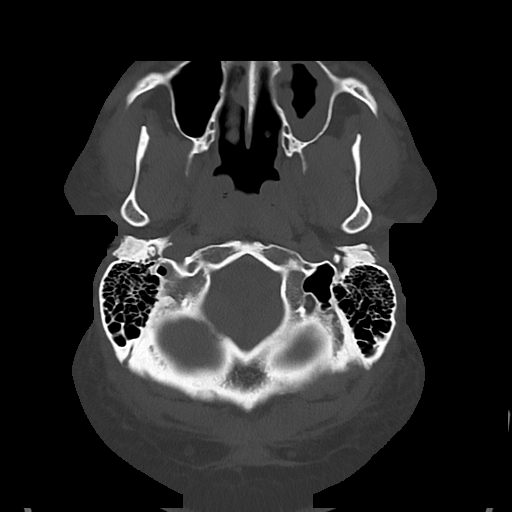
[im 17/81  bone]
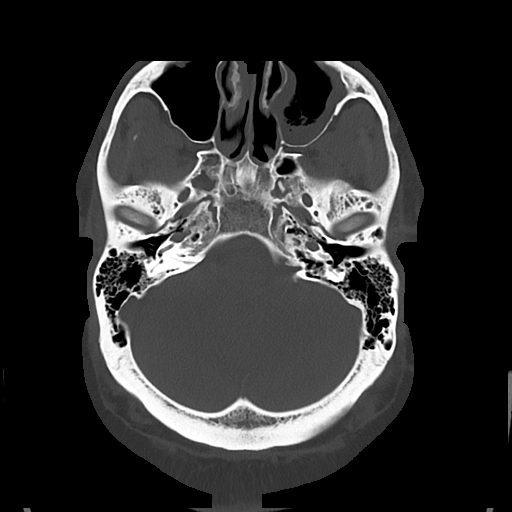
[im 25/81  bone]
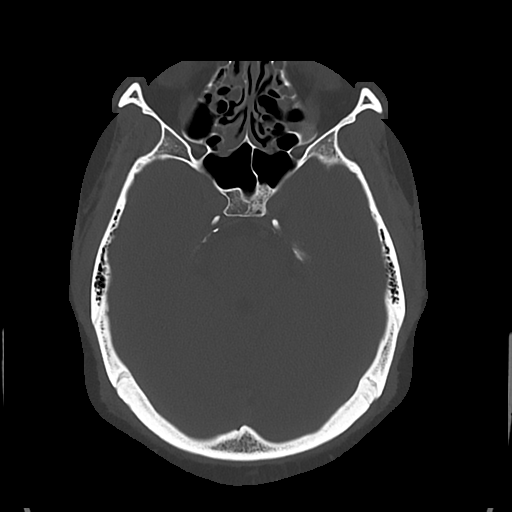

[Series 3: head wo · axial · 0.45mm/px · z∈[-68,+52]mm · 7 of 33 slices shown, 9 images]
[im 5/33  brain]
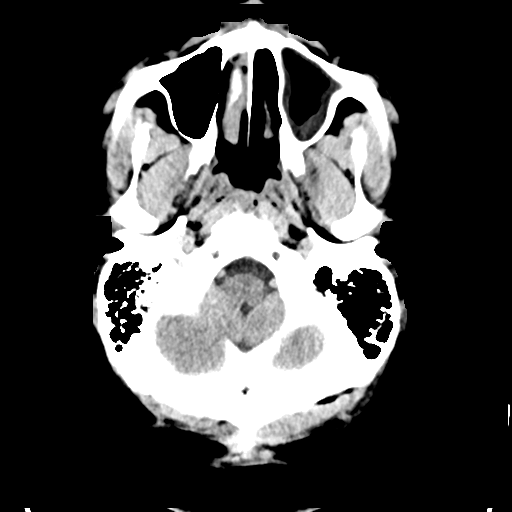
[im 5/33  bone]
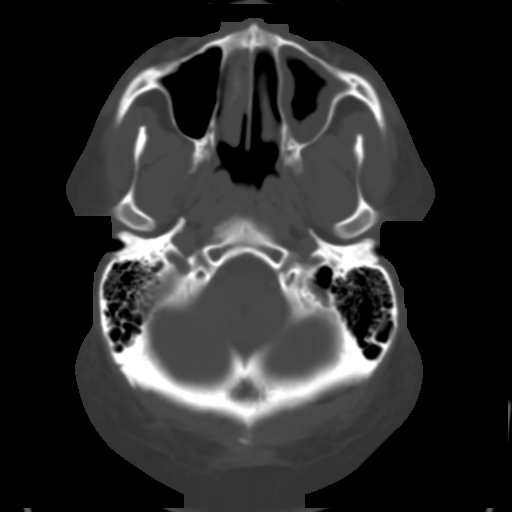
[im 9/33  brain]
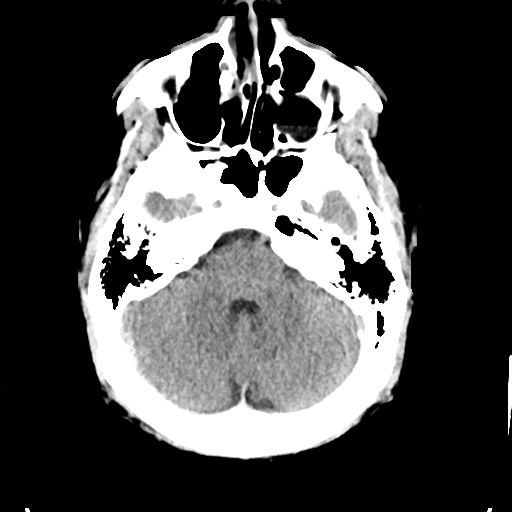
[im 13/33  brain]
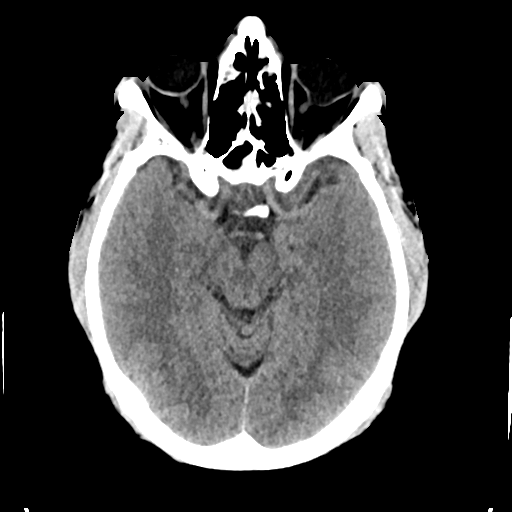
[im 17/33  brain]
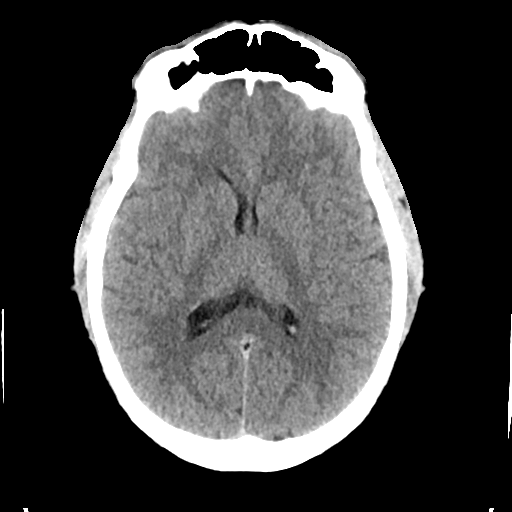
[im 21/33  brain]
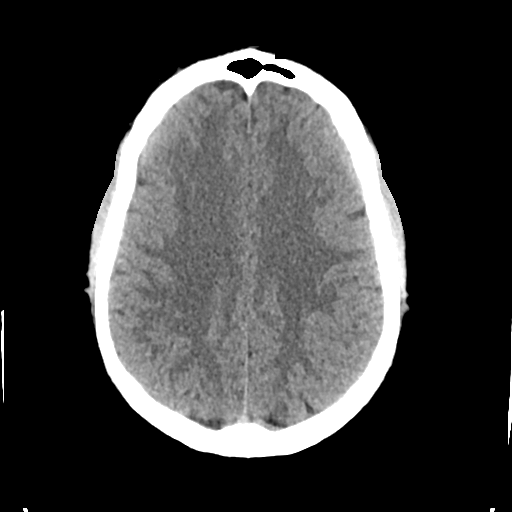
[im 21/33  bone]
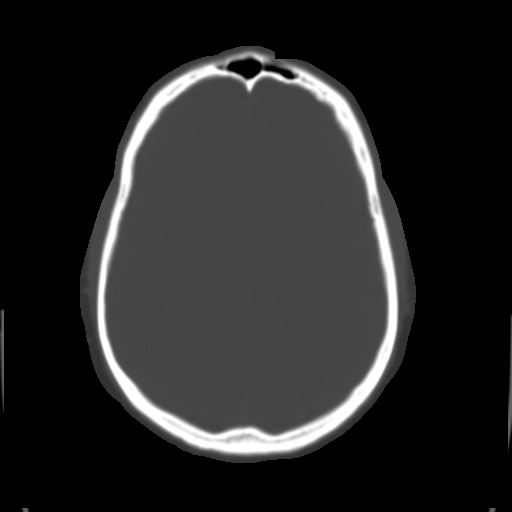
[im 25/33  brain]
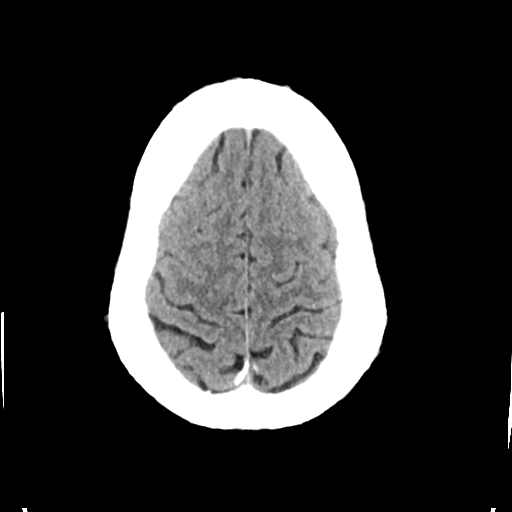
[im 29/33  brain]
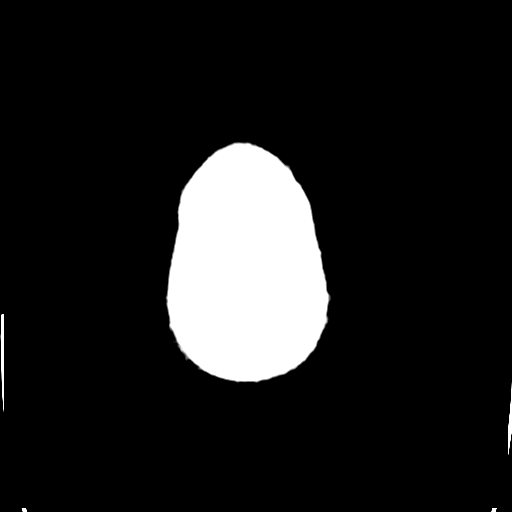

[Series 4: cor soft · coronal · 0.35mm/px · 3 of 83 slices shown]
[im 34/83  brain]
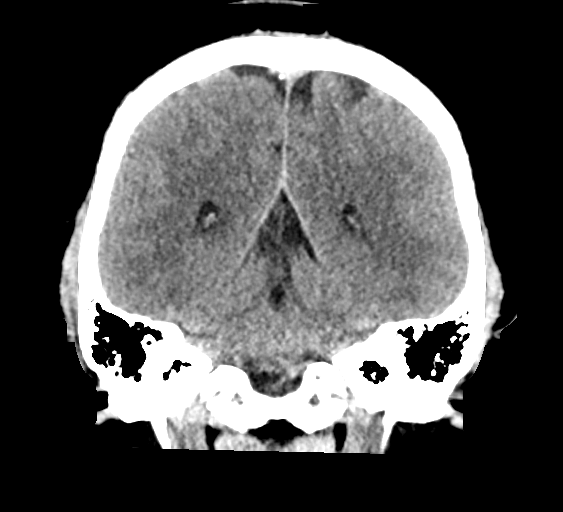
[im 39/83  brain]
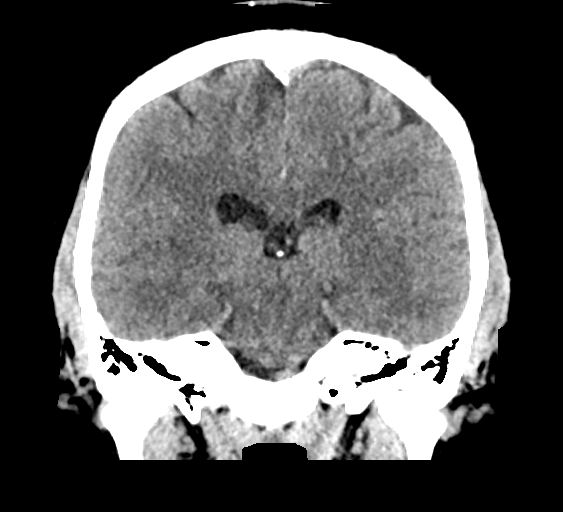
[im 44/83  brain]
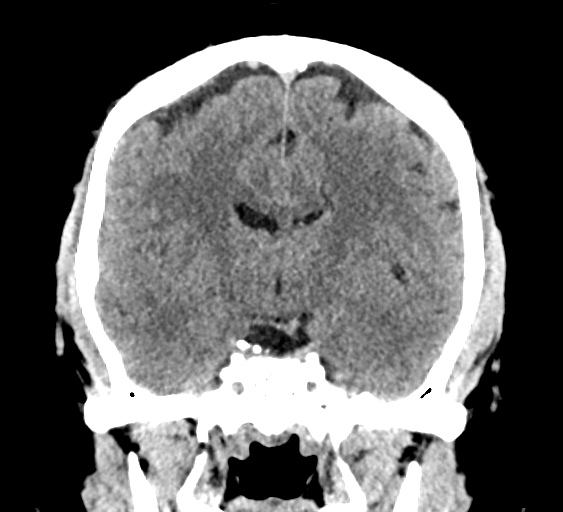

[Series 5: sag soft · sagittal · 0.35mm/px · 3 of 67 slices shown]
[im 23/67  brain]
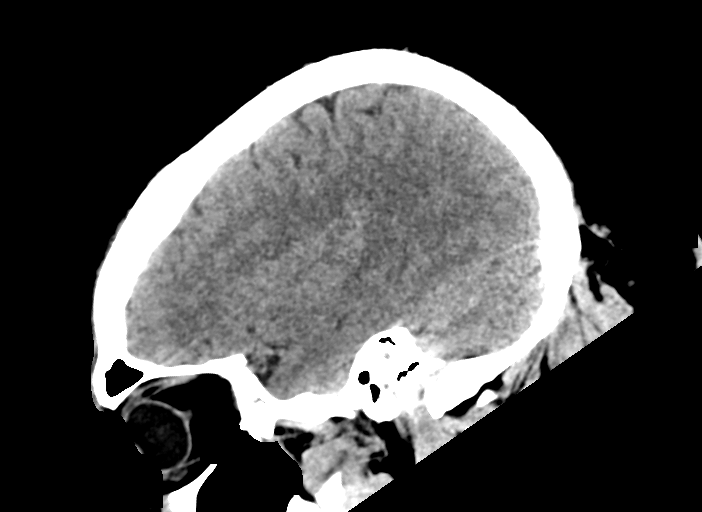
[im 34/67  brain]
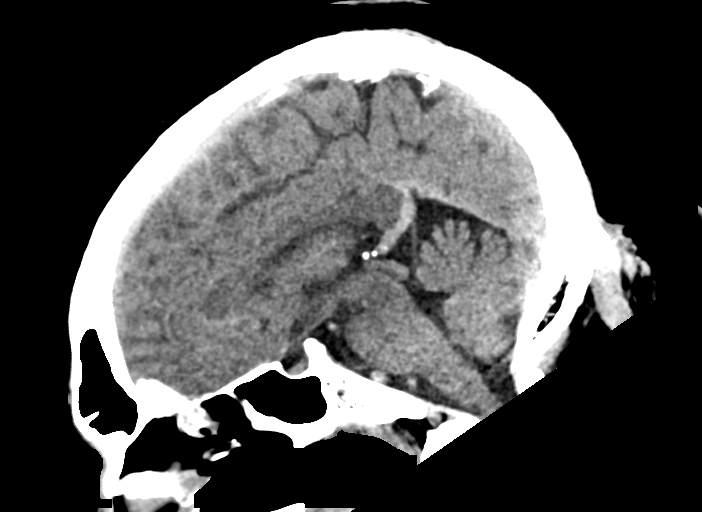
[im 45/67  brain]
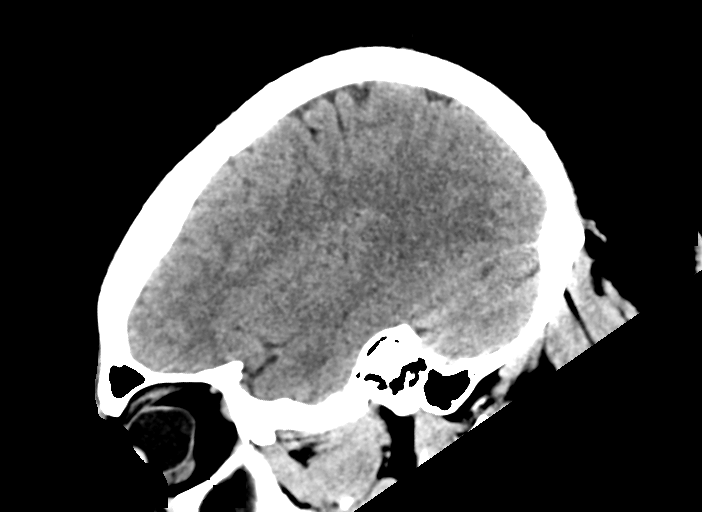

[16 of 47 positions shown; findings below may reference images not displayed]

FINDINGS: Brain: No evidence of acute infarction, hemorrhage, hydrocephalus,
extra-axial collection or mass lesion/mass effect.

Vascular: No hyperdense vessel identified.

Skull: No acute fracture.

Sinuses/Orbits: Moderate mucosal thickening the left maxillary sinus
with frothy secretions and air-fluid level. Moderate mucosal
thickening of scattered ethmoid air cells.

Other: No mastoid effusions.
IMPRESSION: 1. No evidence of acute intracranial abnormality.
2. Predominately ethmoid air cell and left maxillary sinus mucosal
thickening with frothy secretions and air-fluid level. Correlate
with signs/symptoms of sinusitis.

## 2023-08-04 ENCOUNTER — Other Ambulatory Visit: Payer: Self-pay

## 2023-08-04 ENCOUNTER — Emergency Department
Admission: EM | Admit: 2023-08-04 | Discharge: 2023-08-04 | Disposition: A | Discharge status: Home / Self Care | Payer: Self-pay

## 2023-08-04 DIAGNOSIS — I10 Essential (primary) hypertension: Secondary | ICD-10-CM | POA: Insufficient documentation

## 2023-08-04 DIAGNOSIS — W450XXA Nail entering through skin, initial encounter: Secondary | ICD-10-CM | POA: Diagnosis not present

## 2023-08-04 DIAGNOSIS — Z23 Encounter for immunization: Secondary | ICD-10-CM | POA: Insufficient documentation

## 2023-08-04 DIAGNOSIS — S91331A Puncture wound without foreign body, right foot, initial encounter: Secondary | ICD-10-CM | POA: Diagnosis present

## 2023-08-04 MED ORDER — CEPHALEXIN 500 MG PO CAPS: 500.0000 mg | ORAL_CAPSULE | Freq: Four times a day (QID) | ORAL | 0 refills | Status: DC

## 2023-08-04 MED ORDER — CEPHALEXIN 500 MG PO CAPS
500.0000 mg | ORAL_CAPSULE | Freq: Once | ORAL | Status: AC
  Administered 2023-08-04: 500 mg via ORAL
  Filled 2023-08-04: qty 1

## 2023-08-04 MED ORDER — TETANUS-DIPHTH-ACELL PERTUSSIS 5-2.5-18.5 LF-MCG/0.5 IM SUSY
0.5000 mL | PREFILLED_SYRINGE | Freq: Once | INTRAMUSCULAR | Status: AC
  Administered 2023-08-04: 0.5 mL via INTRAMUSCULAR
  Filled 2023-08-04: qty 0.5

## 2023-08-04 MED ORDER — CEPHALEXIN 500 MG PO CAPS: 500.0000 mg | ORAL_CAPSULE | Freq: Two times a day (BID) | ORAL | 0 refills | Status: AC

## 2023-08-04 MED ORDER — KETOROLAC TROMETHAMINE 30 MG/ML IJ SOLN
30.0000 mg | Freq: Once | INTRAMUSCULAR | Status: AC
  Administered 2023-08-04: 30 mg via INTRAMUSCULAR
  Filled 2023-08-04: qty 1

## 2023-08-04 NOTE — Discharge Instructions (Addendum)
 Your evaluated in the ED for a puncture wound of the right foot.  The area is reassuring and does not show any signs of infection.  Continue to keep the area clean with soap and water.  Monitor for signs of infections as discussed.  Alternate taking Tylenol  and ibuprofen  for pain.  Limit physical activity including bearing weight on the affected foot.  Get plenty of rest.  Apply ice to the area to decrease swelling.  Keep the foot elevated above heart level to reduce swelling.  You have been prescribed antibiotics in which you will need to complete.  Follow-up with a primary care provider.  A list has been provided for you below.  Please go to the following website to schedule new (and existing) patient appointments:   [email or web address]   The following is a list of primary care offices in the area who are accepting new patients at this time.  Please reach out to one of them directly and let them know you would like to schedule an appointment to follow up on an Emergency Department visit, and/or to establish a new primary care provider (PCP).  There are likely other primary care clinics in the are who are accepting new patients, but this is an excellent place to start:  [facility] Lead physician: Dr Aden Agreste [address] [place], [place] [address] [phone]  [facility] Lead Physician: Dr Arleen Lacer [address], [place], [place] [address] [phone]  [facility]  Lead Physician: Dr Terre Ferri [address] [place], [place] [address] [phone]  [facility] Lead Physician: Dr Audrie Blind [address], [place], [place] [address] [phone]  [facility] at [facility] Lead Physician: Dr Janna Melter [address] [place], [place], [place] [address] [phone]

## 2023-08-04 NOTE — ED Triage Notes (Signed)
 [place]. R foot. Pt pulled nail out. Unknown last Tdap. Wound did not bleed. Has pain that radiates up R leg.

## 2023-08-04 NOTE — ED Provider Notes (Signed)
 [facility] Emergency Department Provider Note     Event Date/Time   First MD Initiated Contact with Patient 08/04/23 1308     (approximate)   History   [place]   HPI  Todd Contreras is a 43 y.o. male with a PMHx of HTN presents to the ED for evaluation of a puncture wound to his right foot after stepping on a nail [place] a [place] yesterday. He was able to pull the entire nail out of his foot and shoe. States today pain to bottom right foot and mild intermittent tingling running up his leg from his foot to knee. He is ambulatory.  He reports he immediately soaked his foot in alcohol and water yesterday to clean the area.  Denies fever or bleeding from the site.  Tetanus not up-to-date.    Physical Exam   Triage Vital Signs: ED Triage Vitals  Encounter Vitals Group     BP 08/04/23 1247 (!) 140/98     Systolic BP Percentile --      Diastolic BP Percentile --      Pulse Rate 08/04/23 1247 71     Resp 08/04/23 1247 16     Temp 08/04/23 1247 98.6 F (37 C)     Temp Source 08/04/23 1247 Oral     SpO2 08/04/23 1247 97 %     Weight 08/04/23 1244 (!) 367 lb (166.5 kg)     Height 08/04/23 1244 6\' 5"  (1.956 m)     Head Circumference --      Peak Flow --      Pain Score 08/04/23 1243 7     Pain Loc --      Pain Education --      Exclude from Growth Chart --     Most recent vital signs: Vitals:   08/04/23 1247 08/04/23 1411  BP: (!) 140/98 (!) 124/98  Pulse: 71 82  Resp: 16 17  Temp: 98.6 F (37 C) 98.4 F (36.9 C)  SpO2: 97% 98%    General Awake, no distress.  HEENT NCAT. PERRL. EOMI.  CV:  Good peripheral perfusion.  RESP:  Normal effort.  ABD:  No distention.  Other:  Right foot reveals no deformity or foreign body present.  Pinpoint sized puncture wound noted over distal plantar aspect of foot over 4th metatarsal.  No swelling, redness or drainage noted.  Motor and sensation function intact all throughout.   Pedal pulses palpated and are strong.  Full ROM of all digits.  Capillary refills brisk.   ED Results / Procedures / Treatments   Labs (all labs ordered are listed, but only abnormal results are displayed) Labs Reviewed - No data to display  No results found.  PROCEDURES:  Critical Care performed: No  Procedures   MEDICATIONS ORDERED IN ED: Medications  Tdap (BOOSTRIX) injection 0.5 mL (0.5 mLs Intramuscular Given 08/04/23 1343)  ketorolac  (TORADOL ) 30 MG/ML injection 30 mg (30 mg Intramuscular Given 08/04/23 1407)  cephALEXin  (KEFLEX ) capsule 500 mg (500 mg Oral Given 08/04/23 1407)     IMPRESSION / MDM / ASSESSMENT AND PLAN / ED COURSE  I reviewed the triage vital signs and the nursing notes.                               43 y.o. male presents to the emergency department for evaluation and treatment of puncture wound. See  HPI for further details.   Differential diagnosis includes, but is not limited to foreign body, tetanus, cellulitis, nerve damage  Patient's presentation is most consistent with acute complicated illness / injury requiring diagnostic workup.  Patient is alert and oriented.  He is hemodynamically stable and afebrile.  Physical exam findings are stated above.  No foreign body present.  Tetanus administered in the ED.  Pain management with Toradol . 1st dose of Keflex  provided given holiday.  Remaining sent to pharmacy for patient to pick up.  RICE therapy education provided with patient verbalized understanding.  Education on wound care provided.  Patient is stable condition for discharge home and outpatient management.  Referral to primary care ordered.  ED return precaution discussed thoroughly.   FINAL CLINICAL IMPRESSION(S) / ED DIAGNOSES   Final diagnoses:  Puncture wound of right foot, initial encounter   Rx / DC Orders   ED Discharge Orders          Ordered    cephALEXin  (KEFLEX ) 500 MG capsule  4 times daily,   Status:  Discontinued         08/04/23 1344    cephALEXin  (KEFLEX ) 500 MG capsule  2 times daily        08/04/23 1355    Ambulatory Referral to Primary Care (Establish Care)        08/04/23 1356             Note:  This document was prepared using Dragon voice recognition software and may include unintentional dictation errors.    Alvaro Augusta A, PA-C 08/04/23 1453    Collis Deaner, MD 08/04/23 [phone]

## 2023-09-03 ENCOUNTER — Emergency Department
Admission: EM | Admit: 2023-09-03 | Discharge: 2023-09-03 | Disposition: A | Discharge status: Home / Self Care | Attending: Emergency Medicine | Admitting: Emergency Medicine

## 2023-09-03 DIAGNOSIS — J069 Acute upper respiratory infection, unspecified: Secondary | ICD-10-CM | POA: Diagnosis not present

## 2023-09-03 DIAGNOSIS — R590 Localized enlarged lymph nodes: Secondary | ICD-10-CM | POA: Insufficient documentation

## 2023-09-03 DIAGNOSIS — I10 Essential (primary) hypertension: Secondary | ICD-10-CM | POA: Insufficient documentation

## 2023-09-03 DIAGNOSIS — R0981 Nasal congestion: Secondary | ICD-10-CM | POA: Diagnosis present

## 2023-09-03 LAB — CBC
HCT: 39.8 % (ref 39.0–52.0)
Hemoglobin: 14 g/dL (ref 13.0–17.0)
MCH: 30.4 pg (ref 26.0–34.0)
MCHC: 35.2 g/dL (ref 30.0–36.0)
MCV: 86.3 fL (ref 80.0–100.0)
Platelets: 230 10*3/uL (ref 150–400)
RBC: 4.61 MIL/uL (ref 4.22–5.81)
RDW: 12.8 % (ref 11.5–15.5)
WBC: 8 10*3/uL (ref 4.0–10.5)
nRBC: 0 % (ref 0.0–0.2)

## 2023-09-03 LAB — BASIC METABOLIC PANEL WITH GFR
Anion gap: 7 (ref 5–15)
BUN: 12 mg/dL (ref 6–20)
CO2: 23 mmol/L (ref 22–32)
Calcium: 8.3 mg/dL — ABNORMAL LOW (ref 8.9–10.3)
Chloride: 106 mmol/L (ref 98–111)
Creatinine, Ser: 1.1 mg/dL (ref 0.61–1.24)
GFR, Estimated: >60 mL/min (ref >60)
Glucose, Bld: 116 mg/dL — ABNORMAL HIGH (ref 70–99)
Potassium: 3.9 mmol/L (ref 3.5–5.1)
Sodium: 136 mmol/L (ref 135–145)

## 2023-09-03 LAB — RESP PANEL BY RT-PCR (RSV, FLU A&B, COVID)  RVPGX2
Influenza A by PCR: NEGATIVE
Influenza B by PCR: NEGATIVE
Resp Syncytial Virus by PCR: NEGATIVE
SARS Coronavirus 2 by RT PCR: NEGATIVE

## 2023-09-03 MED ORDER — HYDROCOD POLI-CHLORPHE POLI ER 10-8 MG/5ML PO SUER
5.0000 mL | Freq: Once | ORAL | Status: AC
  Administered 2023-09-03: 5 mL via ORAL
  Filled 2023-09-03: qty 5

## 2023-09-03 MED ORDER — GUAIFENESIN-CODEINE 100-10 MG/5ML PO SOLN: 5.0000 mL | Freq: Four times a day (QID) | ORAL | 0 refills | Status: AC | PRN

## 2023-09-03 NOTE — ED Triage Notes (Signed)
 Pt states that he has been coughing and congested for the past couple days, states that he is also having neck pain and that it radiates into bilat arms, states that his back also hurts

## 2023-09-03 NOTE — Discharge Instructions (Addendum)
 Please use your cough medication as needed but only as prescribed.  Do not drink alcohol or drive while taking this medication.  You may also use over-the-counter pseudoephedrine which is available behind the counter at any pharmacy to help with congestion, take as written on the box.  You may also use Tylenol  or ibuprofen  every 6 hours as needed for discomfort as written on the box.

## 2023-09-03 NOTE — ED Provider Notes (Signed)
 [facility] Provider Note    Event Date/Time   First MD Initiated Contact with Patient 09/03/23 [phone]     (approximate)  History   Chief Complaint: Cough and Nasal Congestion  HPI  Todd Contreras is a 43 y.o. male with a past medical history of hypertension who presents to the emergency department for upper respiratory symptoms.  According to the patient for the last 2 days he has been congested with frequent cough.  States he has pain throughout his body when he coughs.  Patient denies any known fever.  Does have some tenderness he states to his left neck (in the area of his lymph nodes).  Denies any sore throat.  Physical Exam   Triage Vital Signs: ED Triage Vitals  Encounter Vitals Group     BP 09/03/23 0910 (!) 154/101     Systolic BP Percentile --      Diastolic BP Percentile --      Pulse Rate 09/03/23 0910 73     Resp 09/03/23 0910 16     Temp 09/03/23 0910 98.4 F (36.9 C)     Temp Source 09/03/23 0910 Oral     SpO2 09/03/23 0910 95 %     Weight 09/03/23 0912 (!) 367 lb (166.5 kg)     Height 09/03/23 0912 6\' 3"  (1.905 m)     Head Circumference --      Peak Flow --      Pain Score 09/03/23 0912 7     Pain Loc --      Pain Education --      Exclude from Growth Chart --     Most recent vital signs: Vitals:   09/03/23 0910  BP: (!) 154/101  Pulse: 73  Resp: 16  Temp: 98.4 F (36.9 C)  SpO2: 95%    General: Awake, no distress.  Mild rhinorrhea.  Occasional cough. CV:  Good peripheral perfusion.  Regular rate and rhythm  Resp:  Normal effort.  Equal breath sounds bilaterally.  No wheeze rales or rhonchi. Abd:  No distention.  Soft, nontender.  Other:  Mild left-sided cervical lymphadenopathy  ED Results / Procedures / Treatments   MEDICATIONS ORDERED IN ED: Medications  chlorpheniramine-HYDROcodone (TUSSIONEX) 10-8 MG/5ML suspension 5 mL (5 mLs Oral Given 09/03/23 0926)     IMPRESSION / MDM / ASSESSMENT AND PLAN /  ED COURSE  I reviewed the triage vital signs and the nursing notes.  Patient's presentation is most consistent with acute presentation with potential threat to life or bodily function.  Patient presents emergency department for upper respiratory symptoms over the last 2 days.  He also states pain throughout his body when he coughs.  He also states some tenderness to the left side of his neck where he has some slightly enlarged lymph nodes with mild tenderness to this area.  Symptoms are very suggestive of viral URI.  We will check basic labs as a precaution and obtain a respiratory panel and continue close monitor.  Will dose Tussionex while awaiting results.  Patient's workup is reassuring with a normal CBC with a normal white blood cell count, reassuring chemistry.  Patient's respiratory panel is negative.  Highly suspect viral URI.  We will recommend supportive care at home we will prescribe a cough medication for the patient.  Patient agreeable to plan of care.  FINAL CLINICAL IMPRESSION(S) / ED DIAGNOSES   Upper respiratory infection   Note:  This document was prepared using Dragon  voice recognition software and may include unintentional dictation errors.   Ruth Cove, MD 09/03/23 1026
